# Patient Record
Sex: Female | Born: 1985
Health system: Southern US, Community
[De-identification: ages and names within clinical notes are randomized; demographics above are authoritative.]

## PROBLEM LIST (undated history)

## (undated) DIAGNOSIS — K219 Gastro-esophageal reflux disease without esophagitis: Secondary | ICD-10-CM

## (undated) DIAGNOSIS — E079 Disorder of thyroid, unspecified: Secondary | ICD-10-CM

## (undated) DIAGNOSIS — E049 Nontoxic goiter, unspecified: Secondary | ICD-10-CM

## (undated) HISTORY — DX: Disorder of thyroid, unspecified: E07.9

## (undated) HISTORY — DX: Gastro-esophageal reflux disease without esophagitis: K21.9

---
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
## 2005-04-14 ENCOUNTER — Other Ambulatory Visit: Admission: RE | Admit: 2005-04-14 | Discharge: 2005-04-14 | Payer: Self-pay | Admitting: Obstetrics and Gynecology

## 2005-08-22 ENCOUNTER — Emergency Department (HOSPITAL_COMMUNITY): Admission: EM | Admit: 2005-08-22 | Discharge: 2005-08-22 | Payer: Self-pay | Admitting: Emergency Medicine

## 2010-09-25 ENCOUNTER — Emergency Department (HOSPITAL_COMMUNITY)
Admission: EM | Admit: 2010-09-25 | Discharge: 2010-09-25 | Payer: Self-pay | Source: Home / Self Care | Admitting: Emergency Medicine

## 2010-11-11 ENCOUNTER — Emergency Department (HOSPITAL_COMMUNITY): Payer: 59

## 2010-11-11 ENCOUNTER — Emergency Department (HOSPITAL_COMMUNITY)
Admission: EM | Admit: 2010-11-11 | Discharge: 2010-11-11 | Disposition: A | Discharge status: Home / Self Care | Payer: 59 | Attending: Emergency Medicine | Admitting: Emergency Medicine

## 2010-11-11 DIAGNOSIS — R000 Tachycardia, unspecified: Secondary | ICD-10-CM | POA: Insufficient documentation

## 2010-11-11 DIAGNOSIS — R0602 Shortness of breath: Secondary | ICD-10-CM | POA: Insufficient documentation

## 2010-11-11 DIAGNOSIS — R071 Chest pain on breathing: Secondary | ICD-10-CM | POA: Insufficient documentation

## 2010-11-11 DIAGNOSIS — R0989 Other specified symptoms and signs involving the circulatory and respiratory systems: Secondary | ICD-10-CM | POA: Insufficient documentation

## 2010-11-11 DIAGNOSIS — R0609 Other forms of dyspnea: Secondary | ICD-10-CM | POA: Insufficient documentation

## 2010-11-11 LAB — POCT I-STAT, CHEM 8
Calcium, Ion: 1.12 mmol/L (ref 1.12–1.32)
Chloride: 106 mEq/L (ref 96–112)
Creatinine, Ser: 0.9 mg/dL (ref 0.4–1.2)
Glucose, Bld: 88 mg/dL (ref 70–99)
HCT: 43 % (ref 36.0–46.0)
Hemoglobin: 14.6 g/dL (ref 12.0–15.0)

## 2010-11-11 LAB — URINALYSIS, ROUTINE W REFLEX MICROSCOPIC
Bilirubin Urine: NEGATIVE
Ketones, ur: NEGATIVE mg/dL
Leukocytes, UA: NEGATIVE
Nitrite: NEGATIVE
Protein, ur: NEGATIVE mg/dL
Specific Gravity, Urine: 1.016 (ref 1.005–1.030)
Urine Glucose, Fasting: NEGATIVE mg/dL
Urobilinogen, UA: 0.2 mg/dL (ref 0.0–1.0)
pH: 6 (ref 5.0–8.0)

## 2010-11-11 LAB — POCT PREGNANCY, URINE: Preg Test, Ur: NEGATIVE

## 2010-11-11 LAB — URINE MICROSCOPIC-ADD ON

## 2010-11-11 MED ORDER — IOHEXOL 300 MG/ML  SOLN
100.0000 mL | Freq: Once | INTRAMUSCULAR | Status: AC | PRN
  Administered 2010-11-11: 100 mL via INTRAVENOUS

## 2010-12-27 ENCOUNTER — Other Ambulatory Visit (HOSPITAL_COMMUNITY): Payer: 59 | Admitting: Radiology

## 2011-01-02 ENCOUNTER — Other Ambulatory Visit (HOSPITAL_COMMUNITY): Payer: Self-pay | Admitting: Internal Medicine

## 2011-01-02 DIAGNOSIS — K828 Other specified diseases of gallbladder: Secondary | ICD-10-CM

## 2011-01-10 ENCOUNTER — Ambulatory Visit (HOSPITAL_COMMUNITY)
Admission: RE | Admit: 2011-01-10 | Discharge: 2011-01-10 | Disposition: A | Discharge status: Home / Self Care | Payer: 59 | Source: Ambulatory Visit | Attending: Internal Medicine | Admitting: Internal Medicine

## 2011-01-10 DIAGNOSIS — R1011 Right upper quadrant pain: Secondary | ICD-10-CM | POA: Insufficient documentation

## 2011-01-10 DIAGNOSIS — K828 Other specified diseases of gallbladder: Secondary | ICD-10-CM

## 2011-01-10 MED ORDER — TECHNETIUM TC 99M MEBROFENIN IV KIT
5.0000 | PACK | Freq: Once | INTRAVENOUS | Status: AC | PRN
  Administered 2011-01-10: 5 via INTRAVENOUS

## 2011-02-20 ENCOUNTER — Other Ambulatory Visit (HOSPITAL_COMMUNITY): Payer: Self-pay | Admitting: Neurology

## 2011-02-20 DIAGNOSIS — M542 Cervicalgia: Secondary | ICD-10-CM

## 2011-02-20 DIAGNOSIS — H5310 Unspecified subjective visual disturbances: Secondary | ICD-10-CM

## 2011-02-24 ENCOUNTER — Ambulatory Visit (HOSPITAL_COMMUNITY)
Admission: RE | Admit: 2011-02-24 | Discharge: 2011-02-24 | Disposition: A | Discharge status: Home / Self Care | Payer: 59 | Source: Ambulatory Visit | Attending: Neurology | Admitting: Neurology

## 2011-02-24 DIAGNOSIS — M542 Cervicalgia: Secondary | ICD-10-CM

## 2011-02-24 DIAGNOSIS — H5310 Unspecified subjective visual disturbances: Secondary | ICD-10-CM

## 2011-02-24 DIAGNOSIS — R51 Headache: Secondary | ICD-10-CM | POA: Insufficient documentation

## 2011-02-24 MED ORDER — GADOBENATE DIMEGLUMINE 529 MG/ML IV SOLN
14.0000 mL | Freq: Once | INTRAVENOUS | Status: AC
  Administered 2011-02-24: 14 mL via INTRAVENOUS

## 2013-02-16 ENCOUNTER — Other Ambulatory Visit: Payer: Self-pay | Admitting: Internal Medicine

## 2013-02-16 DIAGNOSIS — R609 Edema, unspecified: Secondary | ICD-10-CM

## 2013-02-17 ENCOUNTER — Ambulatory Visit
Admission: RE | Admit: 2013-02-17 | Discharge: 2013-02-17 | Disposition: A | Discharge status: Home / Self Care | Payer: 59 | Source: Ambulatory Visit | Attending: Internal Medicine | Admitting: Internal Medicine

## 2013-02-17 DIAGNOSIS — R609 Edema, unspecified: Secondary | ICD-10-CM

## 2013-04-07 IMAGING — NM NM HEPATO W/GB/PHARM/[PERSON_NAME]
2 series · 12 of 12 positions shown · non-contrast
Comparison: None

CLINICAL DATA: Right upper quadrant abdominal pain.

NUCLEAR MEDICINE HEPATOBILIARY IMAGING WITH GALLBLADDER EF
TECHNIQUE: Sequential images of the abdomen were obtained [DATE] minutes following intravenous administration of
radiopharmaceutical.  After slow intravenous infusion of
micrograms Cholecystokinin, gallbladder ejection fraction was
determined.
Radiopharmaceutical:  5.0 mCi 6c-44m Choletec

[hepato · 3.20mm/px · 6 of 51 frames shown (1 of 2)]
[frame 5/51]
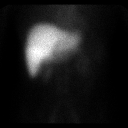
[frame 13/51]
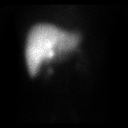
[frame 22/51]
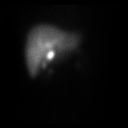
[frame 30/51]
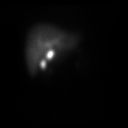
[frame 39/51]
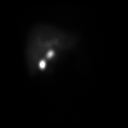
[frame 47/51]
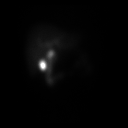

[hepato · 3.20mm/px · 6 of 31 frames shown (2 of 2)]
[frame 3/31]
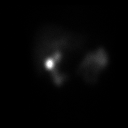
[frame 8/31]
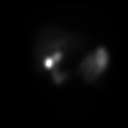
[frame 13/31]
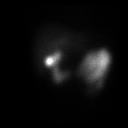
[frame 18/31]
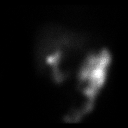
[frame 23/31]
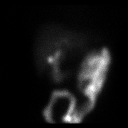
[frame 29/31]
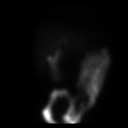

[12 of 12 positions shown; findings below may reference images not displayed]

FINDINGS: There is symmetric uptake in the liver and prompt
excretion into the biliary tree which is visualized by 10 minutes.
The gallbladder is visualized at 03minutes.  Activity is noted in
the small bowel at 20 minutes.

The patient received a protocoled infusion of CCK and a gallbladder
ejection fraction was estimated at 85%.  Normal is greater than
30%.

The patient did experience symptoms during CCK infusion.
IMPRESSION: Normal biliary patency study and gallbladder ejection fraction.

## 2013-09-15 DIAGNOSIS — E049 Nontoxic goiter, unspecified: Secondary | ICD-10-CM

## 2013-09-15 HISTORY — DX: Nontoxic goiter, unspecified: E04.9

## 2013-09-15 NOTE — L&D Delivery Note (Signed)
Delivery Note At 2:26 PM a viable female was delivered via Vaginal, Spontaneous Delivery (Presentation: Left Occiput Anterior).  APGAR:9/9 , ; weight .   Placenta status:manually removed>>intact, cavity explored >>clean,   Intact, Spontaneous.  Cord: 3 vessels with the following complications: None.  Cord pH: not sent  Anesthesia: Epidural  Episiotomy: None Lacerations: 2nd degree;Periurethral;Perineal Suture Repair: 3.0 vicryl rapide Est. Blood Loss (mL): 300  Mom to postpartum.  Baby to Couplet care / Skin to Skin.  Margarette Asal 04/16/2014, 2:46 PM

## 2013-09-20 LAB — OB RESULTS CONSOLE HEPATITIS B SURFACE ANTIGEN: Hepatitis B Surface Ag: NEGATIVE

## 2013-09-20 LAB — OB RESULTS CONSOLE RPR: RPR: NONREACTIVE

## 2013-09-20 LAB — OB RESULTS CONSOLE RUBELLA ANTIBODY, IGM: Rubella: IMMUNE

## 2013-09-20 LAB — OB RESULTS CONSOLE ABO/RH: RH Type: POSITIVE

## 2013-09-20 LAB — OB RESULTS CONSOLE ANTIBODY SCREEN: ANTIBODY SCREEN: NEGATIVE

## 2013-09-20 LAB — OB RESULTS CONSOLE HIV ANTIBODY (ROUTINE TESTING): HIV: NONREACTIVE

## 2013-10-04 LAB — OB RESULTS CONSOLE GC/CHLAMYDIA
Chlamydia: NEGATIVE
Gonorrhea: NEGATIVE

## 2014-02-13 ENCOUNTER — Inpatient Hospital Stay (HOSPITAL_COMMUNITY): Admission: AD | Admit: 2014-02-13 | Payer: 59 | Source: Ambulatory Visit | Admitting: Obstetrics and Gynecology

## 2014-04-16 ENCOUNTER — Inpatient Hospital Stay (HOSPITAL_COMMUNITY)
Admission: AD | Admit: 2014-04-16 | Discharge: 2014-04-18 | DRG: 774 | Disposition: A | Discharge status: Home / Self Care | Payer: 59 | Source: Ambulatory Visit | Attending: Obstetrics and Gynecology | Admitting: Obstetrics and Gynecology

## 2014-04-16 ENCOUNTER — Inpatient Hospital Stay (HOSPITAL_COMMUNITY): Payer: 59 | Admitting: Anesthesiology

## 2014-04-16 ENCOUNTER — Encounter (HOSPITAL_COMMUNITY): Payer: Self-pay | Admitting: *Deleted

## 2014-04-16 ENCOUNTER — Encounter (HOSPITAL_COMMUNITY): Payer: 59 | Admitting: Anesthesiology

## 2014-04-16 LAB — CBC
HCT: 38.2 % (ref 36.0–46.0)
Hemoglobin: 13.1 g/dL (ref 12.0–15.0)
MCH: 30.8 pg (ref 26.0–34.0)
MCHC: 34.3 g/dL (ref 30.0–36.0)
MCV: 89.7 fL (ref 78.0–100.0)
PLATELETS: 219 10*3/uL (ref 150–400)
RBC: 4.26 MIL/uL (ref 3.87–5.11)
RDW: 13.9 % (ref 11.5–15.5)
WBC: 12 10*3/uL — ABNORMAL HIGH (ref 4.0–10.5)

## 2014-04-16 LAB — RPR

## 2014-04-16 LAB — OB RESULTS CONSOLE GBS: STREP GROUP B AG: NEGATIVE

## 2014-04-16 MED ORDER — CITRIC ACID-SODIUM CITRATE 334-500 MG/5ML PO SOLN: 30.0000 mL | ORAL | Status: DC | PRN

## 2014-04-16 MED ORDER — PHENYLEPHRINE 40 MCG/ML (10ML) SYRINGE FOR IV PUSH (FOR BLOOD PRESSURE SUPPORT): 80.0000 ug | PREFILLED_SYRINGE | INTRAVENOUS | Status: DC | PRN

## 2014-04-16 MED ORDER — FLEET ENEMA 7-19 GM/118ML RE ENEM: 1.0000 | ENEMA | RECTAL | Status: DC | PRN

## 2014-04-16 MED ORDER — SENNOSIDES-DOCUSATE SODIUM 8.6-50 MG PO TABS
2.0000 | ORAL_TABLET | ORAL | Status: DC
  Administered 2014-04-17: 2 via ORAL
  Filled 2014-04-16 (×2): qty 2

## 2014-04-16 MED ORDER — EPHEDRINE 5 MG/ML INJ
10.0000 mg | INTRAVENOUS | Status: DC | PRN
  Administered 2014-04-16: 15 mg via INTRAVENOUS

## 2014-04-16 MED ORDER — OXYTOCIN BOLUS FROM INFUSION: 500.0000 mL | INTRAVENOUS | Status: DC

## 2014-04-16 MED ORDER — PRENATAL MULTIVITAMIN CH
1.0000 | ORAL_TABLET | Freq: Every day | ORAL | Status: DC
  Administered 2014-04-17: 1 via ORAL
  Filled 2014-04-16: qty 1

## 2014-04-16 MED ORDER — BISACODYL 10 MG RE SUPP: 10.0000 mg | Freq: Every day | RECTAL | Status: DC | PRN

## 2014-04-16 MED ORDER — ONDANSETRON HCL 4 MG/2ML IJ SOLN: 4.0000 mg | INTRAMUSCULAR | Status: DC | PRN

## 2014-04-16 MED ORDER — PHENYLEPHRINE 40 MCG/ML (10ML) SYRINGE FOR IV PUSH (FOR BLOOD PRESSURE SUPPORT)
80.0000 ug | PREFILLED_SYRINGE | INTRAVENOUS | Status: DC | PRN
  Filled 2014-04-16: qty 10

## 2014-04-16 MED ORDER — IBUPROFEN 800 MG PO TABS
800.0000 mg | ORAL_TABLET | Freq: Three times a day (TID) | ORAL | Status: DC | PRN
  Administered 2014-04-16 – 2014-04-18 (×5): 800 mg via ORAL
  Filled 2014-04-16 (×5): qty 1

## 2014-04-16 MED ORDER — DIPHENHYDRAMINE HCL 25 MG PO CAPS: 25.0000 mg | ORAL_CAPSULE | Freq: Four times a day (QID) | ORAL | Status: DC | PRN

## 2014-04-16 MED ORDER — TETANUS-DIPHTH-ACELL PERTUSSIS 5-2.5-18.5 LF-MCG/0.5 IM SUSP
0.5000 mL | Freq: Once | INTRAMUSCULAR | Status: DC
  Filled 2014-04-16: qty 0.5

## 2014-04-16 MED ORDER — FENTANYL 2.5 MCG/ML BUPIVACAINE 1/10 % EPIDURAL INFUSION (WH - ANES)
INTRAMUSCULAR | Status: DC | PRN
  Administered 2014-04-16: 14 mL/h via EPIDURAL

## 2014-04-16 MED ORDER — DIPHENHYDRAMINE HCL 50 MG/ML IJ SOLN: 12.5000 mg | INTRAMUSCULAR | Status: DC | PRN

## 2014-04-16 MED ORDER — DIBUCAINE 1 % RE OINT: 1.0000 "application " | TOPICAL_OINTMENT | RECTAL | Status: DC | PRN

## 2014-04-16 MED ORDER — OXYCODONE-ACETAMINOPHEN 5-325 MG PO TABS: 1.0000 | ORAL_TABLET | Freq: Four times a day (QID) | ORAL | Status: DC | PRN

## 2014-04-16 MED ORDER — LANOLIN HYDROUS EX OINT: TOPICAL_OINTMENT | CUTANEOUS | Status: DC | PRN

## 2014-04-16 MED ORDER — MEASLES, MUMPS & RUBELLA VAC ~~LOC~~ INJ
0.5000 mL | INJECTION | Freq: Once | SUBCUTANEOUS | Status: DC
  Filled 2014-04-16: qty 0.5

## 2014-04-16 MED ORDER — LIDOCAINE HCL (PF) 1 % IJ SOLN
INTRAMUSCULAR | Status: DC | PRN
  Administered 2014-04-16 (×2): 9 mL

## 2014-04-16 MED ORDER — IBUPROFEN 600 MG PO TABS: 600.0000 mg | ORAL_TABLET | Freq: Four times a day (QID) | ORAL | Status: DC | PRN

## 2014-04-16 MED ORDER — LACTATED RINGERS IV SOLN
500.0000 mL | INTRAVENOUS | Status: DC | PRN
  Administered 2014-04-16: 500 mL via INTRAVENOUS

## 2014-04-16 MED ORDER — FLEET ENEMA 7-19 GM/118ML RE ENEM: 1.0000 | ENEMA | Freq: Every day | RECTAL | Status: DC | PRN

## 2014-04-16 MED ORDER — ZOLPIDEM TARTRATE 5 MG PO TABS: 5.0000 mg | ORAL_TABLET | Freq: Every evening | ORAL | Status: DC | PRN

## 2014-04-16 MED ORDER — SIMETHICONE 80 MG PO CHEW: 80.0000 mg | CHEWABLE_TABLET | ORAL | Status: DC | PRN

## 2014-04-16 MED ORDER — FENTANYL 2.5 MCG/ML BUPIVACAINE 1/10 % EPIDURAL INFUSION (WH - ANES)
14.0000 mL/h | INTRAMUSCULAR | Status: DC | PRN
  Administered 2014-04-16: 14 mL/h via EPIDURAL
  Filled 2014-04-16: qty 125

## 2014-04-16 MED ORDER — ONDANSETRON HCL 4 MG PO TABS: 4.0000 mg | ORAL_TABLET | ORAL | Status: DC | PRN

## 2014-04-16 MED ORDER — ACETAMINOPHEN 325 MG PO TABS
650.0000 mg | ORAL_TABLET | ORAL | Status: DC | PRN
  Filled 2014-04-16: qty 2

## 2014-04-16 MED ORDER — LIDOCAINE HCL (PF) 1 % IJ SOLN: 30.0000 mL | INTRAMUSCULAR | Status: DC | PRN

## 2014-04-16 MED ORDER — OXYTOCIN 40 UNITS IN LACTATED RINGERS INFUSION - SIMPLE MED
62.5000 mL/h | INTRAVENOUS | Status: DC
  Administered 2014-04-16: 500 mL/h via INTRAVENOUS
  Filled 2014-04-16: qty 1000

## 2014-04-16 MED ORDER — EPHEDRINE 5 MG/ML INJ
10.0000 mg | INTRAVENOUS | Status: DC | PRN
  Filled 2014-04-16: qty 4

## 2014-04-16 MED ORDER — LACTATED RINGERS IV SOLN
INTRAVENOUS | Status: DC
  Administered 2014-04-16 (×2): via INTRAVENOUS

## 2014-04-16 MED ORDER — LACTATED RINGERS IV SOLN
500.0000 mL | Freq: Once | INTRAVENOUS | Status: AC
  Administered 2014-04-16: 500 mL via INTRAVENOUS

## 2014-04-16 MED ORDER — BENZOCAINE-MENTHOL 20-0.5 % EX AERO
1.0000 "application " | INHALATION_SPRAY | CUTANEOUS | Status: DC | PRN
  Administered 2014-04-16: 1 via TOPICAL
  Filled 2014-04-16: qty 56

## 2014-04-16 MED ORDER — OXYCODONE-ACETAMINOPHEN 5-325 MG PO TABS: 1.0000 | ORAL_TABLET | ORAL | Status: DC | PRN

## 2014-04-16 MED ORDER — WITCH HAZEL-GLYCERIN EX PADS: 1.0000 "application " | MEDICATED_PAD | CUTANEOUS | Status: DC | PRN

## 2014-04-16 MED ORDER — ONDANSETRON HCL 4 MG/2ML IJ SOLN: 4.0000 mg | Freq: Four times a day (QID) | INTRAMUSCULAR | Status: DC | PRN

## 2014-04-16 NOTE — H&P (Signed)
Jacqueline Chen is a 28 y.o. female presenting for labor. Maternal Medical History:  Reason for admission: Contractions.   Contractions: Onset was 3-5 hours ago.   Frequency: regular.   Perceived severity is moderate.    Fetal activity: Perceived fetal activity is normal.   Last perceived fetal movement was within the past hour.      OB History   Grav Para Term Preterm Abortions TAB SAB Ect Mult Living   1              History reviewed. No pertinent past medical history. History reviewed. No pertinent past surgical history. Family History: family history is not on file. Social History:  reports that she has never smoked. She has never used smokeless tobacco. She reports that she does not drink alcohol or use illicit drugs.   Prenatal Transfer Tool  Maternal Diabetes: No Genetic Screening: Normal Maternal Ultrasounds/Referrals: Normal Fetal Ultrasounds or other Referrals:  None Maternal Substance Abuse:  No Significant Maternal Medications:  None Significant Maternal Lab Results:  None Other Comments:  None  ROS  Dilation: 3 Effacement (%): 90 Station: -2 Exam by:: Deboraha Sprang, RN Blood pressure 120/71, pulse 109, temperature 97.9 F (36.6 C), temperature source Oral, resp. rate 18, height 5\' 7"  (1.702 m), weight 202 lb 6.4 oz (91.808 kg). Maternal Exam:  Abdomen: Fundal height is term FH.   Estimated fetal weight is AGA.   Fetal presentation: vertex  Introitus: Normal vulva. Normal vagina.  Cervix: Cervix evaluated by digital exam.     Physical Exam  Constitutional: She is oriented to person, place, and time. She appears well-developed and well-nourished.  HENT:  Head: Normocephalic and atraumatic.  Neck: Normal range of motion. Neck supple.  Cardiovascular: Normal rate and regular rhythm.   Respiratory: Effort normal and breath sounds normal.  GI:  Term FH/FHR 148  Genitourinary:  3/C/vtx BOW I  Musculoskeletal: Normal range of motion.  Neurological:  She is alert and oriented to person, place, and time.    Prenatal labs: ABO, Rh: O/Positive/-- (01/06 0000) Antibody: Negative (01/06 0000) Rubella: Immune (01/06 0000) RPR: Nonreactive (01/06 0000)  HBsAg: Negative (01/06 0000)  HIV: Non-reactive (01/06 0000)  GBS: Negative (08/02 0453)   Assessment/Plan: Term IUP/labor   Margarette Asal 04/16/2014, 6:59 AM

## 2014-04-16 NOTE — MAU Note (Signed)
Woke up at 1130 pm with contractions

## 2014-04-16 NOTE — Anesthesia Procedure Notes (Signed)
Epidural Patient location during procedure: OB Start time: 04/16/2014 9:42 AM End time: 04/16/2014 9:46 AM  Staffing Anesthesiologist: Lyn Hollingshead Performed by: anesthesiologist   Preanesthetic Checklist Completed: patient identified, surgical consent, pre-op evaluation, timeout performed, IV checked, risks and benefits discussed and monitors and equipment checked  Epidural Patient position: sitting Prep: site prepped and draped and DuraPrep Patient monitoring: continuous pulse ox and blood pressure Approach: midline Location: L3-L4 Injection technique: LOR air  Needle:  Needle type: Tuohy  Needle gauge: 17 G Needle length: 9 cm and 9 Needle insertion depth: 7 cm Catheter type: closed end flexible Catheter size: 19 Gauge Catheter at skin depth: 9 cm Test dose: negative and Other  Assessment Sensory level: T9 Events: blood not aspirated, injection not painful, no injection resistance, negative IV test and no paresthesia  Additional Notes Reason for block:procedure for pain

## 2014-04-16 NOTE — Anesthesia Preprocedure Evaluation (Signed)

## 2014-04-16 NOTE — Treatment Plan (Signed)
Dr Matthew Saras notified of patient.  Pt may ambulate x1 hour and check cervix

## 2014-04-17 LAB — CBC
HCT: 34.7 % — ABNORMAL LOW (ref 36.0–46.0)
Hemoglobin: 11.7 g/dL — ABNORMAL LOW (ref 12.0–15.0)
MCH: 30.5 pg (ref 26.0–34.0)
MCHC: 33.7 g/dL (ref 30.0–36.0)
MCV: 90.4 fL (ref 78.0–100.0)
PLATELETS: 168 10*3/uL (ref 150–400)
RBC: 3.84 MIL/uL — ABNORMAL LOW (ref 3.87–5.11)
RDW: 14.3 % (ref 11.5–15.5)
WBC: 11.1 10*3/uL — AB (ref 4.0–10.5)

## 2014-04-17 NOTE — Lactation Note (Signed)
This note was copied from the chart of Jacqueline Chen. Lactation Consultation Note  Patient Name: Jacqueline Chen [id number] Date: 04/17/2014 Reason for consult: Follow-up assessment Assisted Mom with positioning and latching baby. Baby has tight mouth with suck exam, jaw massage demonstrated to Mom. Reviewed hand expression with Mom and advised to hand express prior to latching baby. It took baby few attempts to latch but once latch demonstrated a good rhythmic suck with some swallows noted. Basic teaching reviewed with Mom. Reviewed cluster feeding. Mom reports nipple tenderness is improving. Advised to apply EBM prn. Advised to call for assist with latch as needed.   Maternal Data    Feeding Feeding Type: Breast Fed Length of feed: 2 min  LATCH Score/Interventions Latch: Repeated attempts needed to sustain latch, nipple held in mouth throughout feeding, stimulation needed to elicit sucking reflex. Intervention(s): Adjust position;Assist with latch;Breast massage;Breast compression  Audible Swallowing: A few with stimulation  Type of Nipple: Everted at rest and after stimulation  Comfort (Breast/Nipple): Filling, red/small blisters or bruises, mild/mod discomfort  Problem noted: Mild/Moderate discomfort Interventions  (Cracked/bleeding/bruising/blister): Expressed breast milk to nipple  Hold (Positioning): Assistance needed to correctly position infant at breast and maintain latch. Intervention(s): Breastfeeding basics reviewed;Support Pillows;Position options;Skin to skin  LATCH Score: 6  Lactation Tools Discussed/Used     Consult Status Consult Status: Follow-up Date: 04/18/14 Follow-up type: In-patient    Katrine Coho 04/17/2014, 6:09 PM

## 2014-04-17 NOTE — Anesthesia Postprocedure Evaluation (Signed)
Anesthesia Post Note  Patient: Jacqueline Chen  Procedure(s) Performed: * No procedures listed *  Anesthesia type: Epidural  Patient location: Mother/Baby  Post pain: Pain level controlled  Post assessment: Post-op Vital signs reviewed  Last Vitals:  Filed Vitals:   04/17/14 0605  BP: 100/66  Pulse: 84  Temp: 36.7 C  Resp: 17    Post vital signs: Reviewed  Level of consciousness:alert  Complications: No apparent anesthesia complications

## 2014-04-17 NOTE — Lactation Note (Signed)
This note was copied from the chart of Jacqueline Chen. Lactation Consultation Note Mom c/o sore nipples, noted bruising to Lt. [place]. Hand expression taught w/noted colostrum. Needed stimulation to nurse. Will not open mouth very wide. Noted slight under bite, instructed to do chin tug. Moms nipples everted, instructed "C" positioning. Mom encouraged to feed baby 8-12 times/24 hours and with feeding cues. Mom encouraged to feed baby w/feeding cuesReviewed Baby & Me book's Breastfeeding Basics. Encouraged to call for assistance if needed and to verify proper latch.[place] brochure given w/resources, support groups and [place] services. Educated about newborn behavior. Referred to Baby and Me Book in Breastfeeding section Pg. 22-23 for position options and Proper latch demonstration. Patient Name: Jacqueline Chen [id number] Date: 04/17/2014 Reason for consult: Initial assessment   Maternal Data Has patient been taught Hand Expression?: Yes Does the patient have breastfeeding experience prior to this delivery?: No  Feeding Feeding Type: Breast Fed Length of feed: 5 min (still feeding)  LATCH Score/Interventions Latch: Repeated attempts needed to sustain latch, nipple held in mouth throughout feeding, stimulation needed to elicit sucking reflex. Intervention(s): Adjust position;Assist with latch;Breast massage;Breast compression  Audible Swallowing: None Intervention(s): Skin to skin;Hand expression  Type of Nipple: Everted at rest and after stimulation  Comfort (Breast/Nipple): Filling, red/small blisters or bruises, mild/mod discomfort  Problem noted: Cracked, bleeding, blisters, bruises Interventions  (Cracked/bleeding/bruising/blister): Expressed breast milk to nipple  Hold (Positioning): Assistance needed to correctly position infant at breast and maintain latch. Intervention(s): Breastfeeding basics reviewed;Support Pillows;Position options;Skin to skin  LATCH Score: 5  Lactation Tools  Discussed/Used     Consult Status Consult Status: Follow-up Date: 04/17/14 Follow-up type: In-patient    Theodoro Kalata 04/17/2014, 5:54 AM

## 2014-04-17 NOTE — Progress Notes (Signed)
Post Partum Day 1 Subjective: no complaints, up ad lib, voiding, tolerating PO and + flatus  Objective: Blood pressure 100/66, pulse 84, temperature 98 F (36.7 C), temperature source Oral, resp. rate 17, height 5\' 7"  (1.702 m), weight 202 lb 6.4 oz (91.808 kg), unknown if currently breastfeeding.  Physical Exam:  General: alert and cooperative Lochia: appropriate Uterine Fundus: firm Incision: perineum intact DVT Evaluation: No evidence of DVT seen on physical exam. Negative Homan's sign. No cords or calf tenderness. No significant calf/ankle edema.   Recent Labs  04/16/14 0655 04/17/14 0601  HGB 13.1 11.7*  HCT 38.2 34.7*    Assessment/Plan: Plan for discharge tomorrow   LOS: 1 day   CURTIS,CAROL G 04/17/2014, 8:22 AM

## 2014-04-18 MED ORDER — IBUPROFEN 800 MG PO TABS: 800.0000 mg | ORAL_TABLET | Freq: Three times a day (TID) | ORAL | Status: DC | PRN

## 2014-04-18 NOTE — Discharge Summary (Signed)
Obstetric Discharge Summary Reason for Admission: onset of labor Prenatal Procedures: ultrasound Intrapartum Procedures: spontaneous vaginal delivery Postpartum Procedures: none Complications-Operative and Postpartum: 2 degree perineal laceration Hemoglobin  Date Value Ref Range Status  04/17/2014 11.7* 12.0 - 15.0 g/dL Final     HCT  Date Value Ref Range Status  04/17/2014 34.7* 36.0 - 46.0 % Final    Physical Exam:  General: alert and cooperative Lochia: appropriate Uterine Fundus: firm Incision: perineum intact DVT Evaluation: No evidence of DVT seen on physical exam. Negative Homan's sign. No cords or calf tenderness. No significant calf/ankle edema.  Discharge Diagnoses: Term Pregnancy-delivered  Discharge Information: Date: 04/18/2014 Activity: pelvic rest Diet: routine Medications: PNV and Ibuprofen Condition: stable Instructions: refer to practice specific booklet Discharge to: home   Newborn Data: Live born female  Birth Weight: 8 lb 1.1 oz (3660 g) APGAR: 8, 9  Home with mother.  Mitch Arquette G 04/18/2014, 7:51 AM

## 2014-04-18 NOTE — Lactation Note (Addendum)
This note was copied from the chart of Jacqueline Aelyn Stanaland. Lactation Consultation Note  Patient Name: Jacqueline Chen [id number] Date: 04/18/2014 Reason for consult: Follow-up assessment Baby 44 hours of life. Mom reports nursing going very well. Mom denies breast/nipple pain. Baby latch deeply, suckling rhythmically with lips flanged outward and swallows noted. Discussed engorgement prevention/treatment, OP/BFSG and calling with any questions/concerns. Discussed how to prepare for nursing/pumping when returning to work. Referred mom to Baby and Me booklet for EBM storage guidelines and number of diapers to expect.  Maternal Data    Feeding Feeding Type:  (Mom nursing when [facility] entered room.)  LATCH Score/Interventions Latch:  (Mom reports no problems with latching baby onto breast.)  Audible Swallowing: A few with stimulation  Type of Nipple: Everted at rest and after stimulation  Comfort (Breast/Nipple): Filling, red/small blisters or bruises, mild/mod discomfort  Problem noted: Mild/Moderate discomfort Interventions  (Cracked/bleeding/bruising/blister): Expressed breast milk to nipple Interventions (Mild/moderate discomfort): Comfort gels  Hold (Positioning): No assistance needed to correctly position infant at breast.  LATCH Score: 8  Lactation Tools Discussed/Used     Consult Status Consult Status: Complete    Lesli Albee, Jolly Carlini 04/18/2014, 10:53 AM

## 2014-06-13 ENCOUNTER — Ambulatory Visit (HOSPITAL_COMMUNITY)
Admission: RE | Admit: 2014-06-13 | Discharge: 2014-06-13 | Disposition: A | Discharge status: Home / Self Care | Payer: 59 | Source: Ambulatory Visit | Attending: Obstetrics and Gynecology | Admitting: Obstetrics and Gynecology

## 2014-06-13 NOTE — Lactation Note (Signed)
Lactation Consult  Mother's reason for visit:  Breastfeeding and preparing back back to  work  Visit Type:  Feeding assessment  Appointment Notes:  BTW questions , apt made form phone call  Consult:  Follow-Up Lactation Consultant:  Myer Haff  ________________________________________________________________________  Jacqueline Chen Name: Jacqueline Chen  Date of Birth: 04/16/2014  Pediatrician: Dr. Monna Fam  Gender: female  Gestational Age: [redacted]w[redacted]d (At Birth)  Birth Weight: 8 lb 1.1 oz (3660 g)  Weight at Discharge: Weight: 7 lb 9 oz (3430 g) Date of Discharge: 04/18/2014  Filed Weights   04/16/14 1426 04/17/14 0010 04/17/14 2344  Weight: 8 lb 1.1 oz (3660 g) 8 lb 0.8 oz (3650 g) 7 lb 9 oz (3430 g)  Last weight taken from location outside of [facility]: 11.1 oz  Location:Pediatrician's office  Weight today: 12.6.3 oz    ________________________________________________________________________  Mother's Name: Milagros Evener Type of delivery:   Breastfeeding Experience:  Per mom very good   ________________________________________________________________________  Breastfeeding History (Post Discharge)  Frequency of breastfeeding:  Every 2-4 hours days , and every  4 hours at night  Duration of feeding:  7-10 mins   Pumping - per mom - Once a day with a DEBP with 8 oz return  Supplementing - none , except when dad gives bottle EBM for a feeding and I pump   Infant Intake and Output Assessment  Voids:  8-10  in 24 hrs.  Color:  Clear yellow Stools:  1  in 24 hrs.  Color:  Yellow  @consult  after feeding baby had a large pea green stool - loose , per mom it was the 1st stool she had had in 3 days   ________________________________________________________________________  Maternal Breast Assessment  Breast:  Full Nipple:  Flat Pain level:  0 Pain interventions:  Expressed breast milk  _______________________________________________________________________ Feeding  Assessment/Evaluation  Initial feeding assessment:  Infant's oral assessment:  WNL  Positioning:  Cross cradle Right breast  LATCH documentation:  Latch:  2 = Grasps breast easily, tongue down, lips flanged, rhythmical sucking.  Audible swallowing:  2 = Spontaneous and intermittent  Type of nipple:  2 = Everted at rest and after stimulation  Comfort (Breast/Nipple):  1 = Filling, red/small blisters or bruises, mild/mod discomfort ( Full , no breakdown noted )   Hold (Positioning):  2 = No assistance needed to correctly position infant at breast  LATCH score:  9   Attached assessment:  Deep  Lips flanged:  Yes.    Lips untucked:  Yes.    Suck assessment:  Nutritive  Tools:  None  Instructed on use and cleaning of tool:  Non tools   Pre-feed weight:  5622g  (12 lb. 6.3  oz.) Post-feed weight:  5698 g (12 lb. 9.0  oz.) Amount transferred:  76  ml Amount supplemented:  None needed   Baby content after feeding and after large stool diaper changed , only fed on the 1st breast ,   Total amount pumped post feed:  None   Total amount transferred:  76 ml  Total supplement given:  None   Lactation Plan of Care-  Praised mom for her efforts breast feeding and pumping to prepare to return to work                                         - per mom F/U with  Dr. Janann Colonel 10/9                                         - Encouraged mom to attend the BFSG on Monday Evening at 7pm or Tuesday am at 11am to share her success                                        - Breast feeding latching - If breast are full to start , hand express or pump off some milk to baby gets to creamy fat                                         - transitioning back to work - Continue the plan you have started - doing well with preparation to return to work                                                                                          - Post pump after am feeding, and pump when dad is gibing the baby her one bottle  a day                                         - The day you return to work , feed the baby and plan on pumping both breast for 10-15 mins to soften well                                         - Plan on pumping at work 10-15 mins about every 2-4 hours , soften breast well                                         - If you go to feed the abby at day care , plan to have dad feed the bottle at dinner time and pump

## 2014-07-13 ENCOUNTER — Other Ambulatory Visit: Payer: Self-pay | Admitting: Internal Medicine

## 2014-07-13 DIAGNOSIS — E049 Nontoxic goiter, unspecified: Secondary | ICD-10-CM

## 2014-07-14 ENCOUNTER — Ambulatory Visit
Admission: RE | Admit: 2014-07-14 | Discharge: 2014-07-14 | Disposition: A | Discharge status: Home / Self Care | Payer: 59 | Source: Ambulatory Visit | Attending: Internal Medicine | Admitting: Internal Medicine

## 2014-07-14 DIAGNOSIS — E049 Nontoxic goiter, unspecified: Secondary | ICD-10-CM

## 2014-07-17 ENCOUNTER — Encounter (HOSPITAL_COMMUNITY): Payer: Self-pay | Admitting: *Deleted

## 2014-11-20 ENCOUNTER — Telehealth: Payer: Self-pay | Admitting: Family

## 2014-11-20 DIAGNOSIS — J019 Acute sinusitis, unspecified: Secondary | ICD-10-CM

## 2014-11-20 MED ORDER — AMOXICILLIN-POT CLAVULANATE 875-125 MG PO TABS: 1.0000 | ORAL_TABLET | Freq: Two times a day (BID) | ORAL | Status: DC

## 2014-11-20 NOTE — Progress Notes (Signed)

## 2015-07-19 LAB — T4, FREE: FREE T4: 1.3

## 2015-07-19 LAB — TSH: TSH: 2.02 (ref <=5.90)

## 2015-07-20 ENCOUNTER — Other Ambulatory Visit: Payer: Self-pay | Admitting: Internal Medicine

## 2015-07-20 DIAGNOSIS — E049 Nontoxic goiter, unspecified: Secondary | ICD-10-CM

## 2015-07-23 ENCOUNTER — Ambulatory Visit
Admission: RE | Admit: 2015-07-23 | Discharge: 2015-07-23 | Disposition: A | Discharge status: Home / Self Care | Payer: 59 | Source: Ambulatory Visit | Attending: Internal Medicine | Admitting: Internal Medicine

## 2015-07-23 DIAGNOSIS — E049 Nontoxic goiter, unspecified: Secondary | ICD-10-CM

## 2015-09-20 DIAGNOSIS — M9901 Segmental and somatic dysfunction of cervical region: Secondary | ICD-10-CM | POA: Diagnosis not present

## 2015-09-20 DIAGNOSIS — R51 Headache: Secondary | ICD-10-CM | POA: Diagnosis not present

## 2015-09-20 DIAGNOSIS — M9902 Segmental and somatic dysfunction of thoracic region: Secondary | ICD-10-CM | POA: Diagnosis not present

## 2015-09-21 ENCOUNTER — Telehealth: Payer: 59 | Admitting: Nurse Practitioner

## 2015-09-21 DIAGNOSIS — J019 Acute sinusitis, unspecified: Secondary | ICD-10-CM | POA: Diagnosis not present

## 2015-09-21 MED ORDER — AMOXICILLIN-POT CLAVULANATE 875-125 MG PO TABS: 1.0000 | ORAL_TABLET | Freq: Two times a day (BID) | ORAL | Status: DC

## 2015-09-21 MED FILL — AMOX TR-K CLV 875-125 MG TA: 875-125 | 7 days supply | Qty: 14 | Fill #0

## 2015-09-21 NOTE — Progress Notes (Signed)

## 2015-10-18 DIAGNOSIS — M9902 Segmental and somatic dysfunction of thoracic region: Secondary | ICD-10-CM | POA: Diagnosis not present

## 2015-10-18 DIAGNOSIS — R51 Headache: Secondary | ICD-10-CM | POA: Diagnosis not present

## 2015-10-18 DIAGNOSIS — M9901 Segmental and somatic dysfunction of cervical region: Secondary | ICD-10-CM | POA: Diagnosis not present

## 2015-10-25 DIAGNOSIS — M9902 Segmental and somatic dysfunction of thoracic region: Secondary | ICD-10-CM | POA: Diagnosis not present

## 2015-10-25 DIAGNOSIS — R51 Headache: Secondary | ICD-10-CM | POA: Diagnosis not present

## 2015-10-25 DIAGNOSIS — M9901 Segmental and somatic dysfunction of cervical region: Secondary | ICD-10-CM | POA: Diagnosis not present

## 2015-10-29 MED FILL — LEVOTHYROXINE 25 MCG TABLET: 25 | 90 days supply | Qty: 90 | Fill #0

## 2015-10-29 MED FILL — LARIN FE 1-20 TABLET: 1-20 | 84 days supply | Qty: 84 | Fill #3

## 2015-11-22 DIAGNOSIS — R51 Headache: Secondary | ICD-10-CM | POA: Diagnosis not present

## 2015-11-22 DIAGNOSIS — M9901 Segmental and somatic dysfunction of cervical region: Secondary | ICD-10-CM | POA: Diagnosis not present

## 2015-11-22 DIAGNOSIS — M9902 Segmental and somatic dysfunction of thoracic region: Secondary | ICD-10-CM | POA: Diagnosis not present

## 2015-12-18 DIAGNOSIS — R51 Headache: Secondary | ICD-10-CM | POA: Diagnosis not present

## 2015-12-18 DIAGNOSIS — M9901 Segmental and somatic dysfunction of cervical region: Secondary | ICD-10-CM | POA: Diagnosis not present

## 2015-12-18 DIAGNOSIS — M9902 Segmental and somatic dysfunction of thoracic region: Secondary | ICD-10-CM | POA: Diagnosis not present

## 2015-12-19 ENCOUNTER — Telehealth: Payer: 59 | Admitting: Family

## 2015-12-19 DIAGNOSIS — H1089 Other conjunctivitis: Secondary | ICD-10-CM | POA: Diagnosis not present

## 2015-12-19 DIAGNOSIS — H109 Unspecified conjunctivitis: Secondary | ICD-10-CM

## 2015-12-19 DIAGNOSIS — A499 Bacterial infection, unspecified: Secondary | ICD-10-CM

## 2015-12-19 MED ORDER — POLYMYXIN B-TRIMETHOPRIM 10000-0.1 UNIT/ML-% OP SOLN: 1.0000 [drp] | OPHTHALMIC | Status: DC

## 2015-12-19 MED FILL — POLYMYXIN B/TMP EYE DROPS: 10000-0.1 | 16 days supply | Qty: 10 | Fill #0

## 2015-12-19 NOTE — Progress Notes (Signed)

## 2015-12-20 DIAGNOSIS — H52222 Regular astigmatism, left eye: Secondary | ICD-10-CM | POA: Diagnosis not present

## 2015-12-20 DIAGNOSIS — H5212 Myopia, left eye: Secondary | ICD-10-CM | POA: Diagnosis not present

## 2016-01-15 DIAGNOSIS — M9902 Segmental and somatic dysfunction of thoracic region: Secondary | ICD-10-CM | POA: Diagnosis not present

## 2016-01-15 DIAGNOSIS — M9901 Segmental and somatic dysfunction of cervical region: Secondary | ICD-10-CM | POA: Diagnosis not present

## 2016-01-15 DIAGNOSIS — R51 Headache: Secondary | ICD-10-CM | POA: Diagnosis not present

## 2016-01-21 MED FILL — LARIN FE 1-20 TABLET: 1-20 | 56 days supply | Qty: 56 | Fill #4

## 2016-01-28 MED FILL — LEVOTHYROXINE 25 MCG TABLET: 25 | 90 days supply | Qty: 90 | Fill #1

## 2016-02-19 DIAGNOSIS — M9902 Segmental and somatic dysfunction of thoracic region: Secondary | ICD-10-CM | POA: Diagnosis not present

## 2016-02-19 DIAGNOSIS — R51 Headache: Secondary | ICD-10-CM | POA: Diagnosis not present

## 2016-02-19 DIAGNOSIS — M9901 Segmental and somatic dysfunction of cervical region: Secondary | ICD-10-CM | POA: Diagnosis not present

## 2016-03-25 DIAGNOSIS — M9902 Segmental and somatic dysfunction of thoracic region: Secondary | ICD-10-CM | POA: Diagnosis not present

## 2016-03-25 DIAGNOSIS — R51 Headache: Secondary | ICD-10-CM | POA: Diagnosis not present

## 2016-03-25 DIAGNOSIS — M9901 Segmental and somatic dysfunction of cervical region: Secondary | ICD-10-CM | POA: Diagnosis not present

## 2016-04-22 DIAGNOSIS — R51 Headache: Secondary | ICD-10-CM | POA: Diagnosis not present

## 2016-04-22 DIAGNOSIS — M9902 Segmental and somatic dysfunction of thoracic region: Secondary | ICD-10-CM | POA: Diagnosis not present

## 2016-04-22 DIAGNOSIS — M9901 Segmental and somatic dysfunction of cervical region: Secondary | ICD-10-CM | POA: Diagnosis not present

## 2016-04-28 MED FILL — LEVOTHYROXINE 25 MCG TABLET: 25 | 90 days supply | Qty: 90 | Fill #0

## 2016-05-20 DIAGNOSIS — R51 Headache: Secondary | ICD-10-CM | POA: Diagnosis not present

## 2016-05-20 DIAGNOSIS — M9902 Segmental and somatic dysfunction of thoracic region: Secondary | ICD-10-CM | POA: Diagnosis not present

## 2016-05-20 DIAGNOSIS — M9901 Segmental and somatic dysfunction of cervical region: Secondary | ICD-10-CM | POA: Diagnosis not present

## 2016-05-29 DIAGNOSIS — Z01419 Encounter for gynecological examination (general) (routine) without abnormal findings: Secondary | ICD-10-CM | POA: Diagnosis not present

## 2016-05-29 DIAGNOSIS — Z6824 Body mass index (BMI) 24.0-24.9, adult: Secondary | ICD-10-CM | POA: Diagnosis not present

## 2016-06-17 DIAGNOSIS — M9901 Segmental and somatic dysfunction of cervical region: Secondary | ICD-10-CM | POA: Diagnosis not present

## 2016-06-17 DIAGNOSIS — R51 Headache: Secondary | ICD-10-CM | POA: Diagnosis not present

## 2016-06-17 DIAGNOSIS — M9902 Segmental and somatic dysfunction of thoracic region: Secondary | ICD-10-CM | POA: Diagnosis not present

## 2016-07-15 DIAGNOSIS — M9901 Segmental and somatic dysfunction of cervical region: Secondary | ICD-10-CM | POA: Diagnosis not present

## 2016-07-15 DIAGNOSIS — M9902 Segmental and somatic dysfunction of thoracic region: Secondary | ICD-10-CM | POA: Diagnosis not present

## 2016-07-15 DIAGNOSIS — R51 Headache: Secondary | ICD-10-CM | POA: Diagnosis not present

## 2016-07-28 DIAGNOSIS — L814 Other melanin hyperpigmentation: Secondary | ICD-10-CM | POA: Diagnosis not present

## 2016-07-28 DIAGNOSIS — D235 Other benign neoplasm of skin of trunk: Secondary | ICD-10-CM | POA: Diagnosis not present

## 2016-07-28 DIAGNOSIS — D1801 Hemangioma of skin and subcutaneous tissue: Secondary | ICD-10-CM | POA: Diagnosis not present

## 2016-07-28 MED FILL — LEVOTHYROXINE 25 MCG TABLET: 25 | 90 days supply | Qty: 90 | Fill #1

## 2016-08-11 DIAGNOSIS — Z0000 Encounter for general adult medical examination without abnormal findings: Secondary | ICD-10-CM | POA: Diagnosis not present

## 2016-08-11 DIAGNOSIS — D492 Neoplasm of unspecified behavior of bone, soft tissue, and skin: Secondary | ICD-10-CM | POA: Diagnosis not present

## 2016-08-11 DIAGNOSIS — E049 Nontoxic goiter, unspecified: Secondary | ICD-10-CM | POA: Diagnosis not present

## 2016-08-11 DIAGNOSIS — Z6824 Body mass index (BMI) 24.0-24.9, adult: Secondary | ICD-10-CM | POA: Diagnosis not present

## 2016-08-11 DIAGNOSIS — F419 Anxiety disorder, unspecified: Secondary | ICD-10-CM | POA: Diagnosis not present

## 2016-08-12 ENCOUNTER — Other Ambulatory Visit: Payer: Self-pay | Admitting: Internal Medicine

## 2016-08-12 DIAGNOSIS — R51 Headache: Secondary | ICD-10-CM | POA: Diagnosis not present

## 2016-08-12 DIAGNOSIS — M9902 Segmental and somatic dysfunction of thoracic region: Secondary | ICD-10-CM | POA: Diagnosis not present

## 2016-08-12 DIAGNOSIS — E049 Nontoxic goiter, unspecified: Secondary | ICD-10-CM

## 2016-08-12 DIAGNOSIS — M9901 Segmental and somatic dysfunction of cervical region: Secondary | ICD-10-CM | POA: Diagnosis not present

## 2016-08-15 ENCOUNTER — Ambulatory Visit
Admission: RE | Admit: 2016-08-15 | Discharge: 2016-08-15 | Disposition: A | Discharge status: Home / Self Care | Payer: 59 | Source: Ambulatory Visit | Attending: Internal Medicine | Admitting: Internal Medicine

## 2016-08-15 DIAGNOSIS — E042 Nontoxic multinodular goiter: Secondary | ICD-10-CM | POA: Diagnosis not present

## 2016-08-15 DIAGNOSIS — E049 Nontoxic goiter, unspecified: Secondary | ICD-10-CM

## 2016-09-01 DIAGNOSIS — R51 Headache: Secondary | ICD-10-CM | POA: Diagnosis not present

## 2016-09-01 DIAGNOSIS — M9901 Segmental and somatic dysfunction of cervical region: Secondary | ICD-10-CM | POA: Diagnosis not present

## 2016-09-01 DIAGNOSIS — M9902 Segmental and somatic dysfunction of thoracic region: Secondary | ICD-10-CM | POA: Diagnosis not present

## 2016-10-05 ENCOUNTER — Telehealth: Payer: 59 | Admitting: Family

## 2016-10-05 DIAGNOSIS — B372 Candidiasis of skin and nail: Secondary | ICD-10-CM | POA: Diagnosis not present

## 2016-10-05 MED ORDER — NYSTATIN 100000 UNIT/GM EX CREA: 1.0000 "application " | TOPICAL_CREAM | Freq: Two times a day (BID) | CUTANEOUS | 0 refills | Status: DC

## 2016-10-05 NOTE — Progress Notes (Signed)
E Visit for Rash  We are sorry that you are not feeling well. Here is how we plan to help!    Based upon your presentation it appears you have a fungal infection.  I have prescribed: and Nystatin cream apply to the affected area twice daily   HOME CARE:   Take cool showers and avoid direct sunlight.  Apply cool compress or wet dressings.  Take a bath in an oatmeal bath.  Sprinkle content of one Aveeno packet under running faucet with comfortably warm water.  Bathe for 15-20 minutes, 1-2 times daily.  Pat dry with a towel. Do not rub the rash.  Use hydrocortisone cream.  Take an antihistamine like Benadryl for widespread rashes that itch.  The adult dose of Benadryl is 25-50 mg by mouth 4 times daily.  Caution:  This type of medication may cause sleepiness.  Do not drink alcohol, drive, or operate dangerous machinery while taking antihistamines.  Do not take these medications if you have prostate enlargement.  Read package instructions thoroughly on all medications that you take.  GET HELP RIGHT AWAY IF:   Symptoms don't go away after treatment.  Severe itching that persists.  If you rash spreads or swells.  If you rash begins to smell.  If it blisters and opens or develops a yellow-brown crust.  You develop a fever.  You have a sore throat.  You become short of breath.  MAKE SURE YOU:  Understand these instructions. Will watch your condition. Will get help right away if you are not doing well or get worse.  Thank you for choosing an e-visit. Your e-visit answers were reviewed by a board certified advanced clinical practitioner to complete your personal care plan. Depending upon the condition, your plan could have included both over the counter or prescription medications. Please review your pharmacy choice. Be sure that the pharmacy you have chosen is open so that you can pick up your prescription now.  If there is a problem you may message your provider in MyChart  to have the prescription routed to another pharmacy. Your safety is important to us. If you have drug allergies check your prescription carefully.  For the next 24 hours, you can use MyChart to ask questions about today's visit, request a non-urgent call back, or ask for a work or school excuse from your e-visit provider. You will get an email in the next two days asking about your experience. I hope that your e-visit has been valuable and will speed your recovery.      

## 2016-10-06 MED FILL — NYSTATIN 100,000 UNIT/GM CR: 100000 | 30 days supply | Qty: 60 | Fill #0

## 2016-10-07 DIAGNOSIS — R51 Headache: Secondary | ICD-10-CM | POA: Diagnosis not present

## 2016-10-07 DIAGNOSIS — M9902 Segmental and somatic dysfunction of thoracic region: Secondary | ICD-10-CM | POA: Diagnosis not present

## 2016-10-07 DIAGNOSIS — M9901 Segmental and somatic dysfunction of cervical region: Secondary | ICD-10-CM | POA: Diagnosis not present

## 2016-10-10 ENCOUNTER — Telehealth: Payer: 59 | Admitting: Family

## 2016-10-10 DIAGNOSIS — R21 Rash and other nonspecific skin eruption: Secondary | ICD-10-CM | POA: Diagnosis not present

## 2016-10-10 DIAGNOSIS — L42 Pityriasis rosea: Secondary | ICD-10-CM | POA: Diagnosis not present

## 2016-10-10 MED ORDER — PREDNISONE 10 MG PO TABS: 10.0000 mg | ORAL_TABLET | Freq: Every day | ORAL | 0 refills | Status: DC

## 2016-10-10 MED FILL — TRIAMCINOLONE 0.1% CREAM: 0.1 | 30 days supply | Qty: 454 | Fill #0

## 2016-10-10 NOTE — Progress Notes (Signed)
E Visit for Rash  We are sorry that you are not feeling well. Here is how we plan to help!  Given that it is spreading, it may be arising from something inside your body rather than something on the surface. It could be a colonization of yeast if the cream is helping. Continue using the cream and we will add the prednisone. Let us know if it gets worse. If it spread more, you may need to be seen face-to-face.   I have prescribed Prednisone 10 mg daily for 5 days    HOME CARE:   Take cool showers and avoid direct sunlight.  Apply cool compress or wet dressings.  Take a bath in an oatmeal bath.  Sprinkle content of one Aveeno packet under running faucet with comfortably warm water.  Bathe for 15-20 minutes, 1-2 times daily.  Pat dry with a towel. Do not rub the rash.  Use hydrocortisone cream.  Take an antihistamine like Benadryl for widespread rashes that itch.  The adult dose of Benadryl is 25-50 mg by mouth 4 times daily.  Caution:  This type of medication may cause sleepiness.  Do not drink alcohol, drive, or operate dangerous machinery while taking antihistamines.  Do not take these medications if you have prostate enlargement.  Read package instructions thoroughly on all medications that you take.  GET HELP RIGHT AWAY IF:   Symptoms don't go away after treatment.  Severe itching that persists.  If you rash spreads or swells.  If you rash begins to smell.  If it blisters and opens or develops a yellow-brown crust.  You develop a fever.  You have a sore throat.  You become short of breath.  MAKE SURE YOU:  Understand these instructions. Will watch your condition. Will get help right away if you are not doing well or get worse.  Thank you for choosing an e-visit. Your e-visit answers were reviewed by a board certified advanced clinical practitioner to complete your personal care plan. Depending upon the condition, your plan could have included both over the counter  or prescription medications. Please review your pharmacy choice. Be sure that the pharmacy you have chosen is open so that you can pick up your prescription now.  If there is a problem you may message your provider in [place] to have the prescription routed to another pharmacy. Your safety is important to [place]. If you have drug allergies check your prescription carefully.  For the next 24 hours, you can use MyChart to ask questions about today's visit, request a non-urgent call back, or ask for a work or school excuse from your e-visit provider. You will get an email in the next two days asking about your experience. I hope that your e-visit has been valuable and will speed your recovery.     Marland Kitchen

## 2016-10-27 MED FILL — LEVOTHYROXINE 25 MCG TABLET: 25 | 90 days supply | Qty: 90 | Fill #0

## 2016-11-05 DIAGNOSIS — N911 Secondary amenorrhea: Secondary | ICD-10-CM | POA: Diagnosis not present

## 2016-11-06 DIAGNOSIS — R51 Headache: Secondary | ICD-10-CM | POA: Diagnosis not present

## 2016-11-06 DIAGNOSIS — M9901 Segmental and somatic dysfunction of cervical region: Secondary | ICD-10-CM | POA: Diagnosis not present

## 2016-11-06 DIAGNOSIS — M9902 Segmental and somatic dysfunction of thoracic region: Secondary | ICD-10-CM | POA: Diagnosis not present

## 2016-11-11 DIAGNOSIS — Z3685 Encounter for antenatal screening for Streptococcus B: Secondary | ICD-10-CM | POA: Diagnosis not present

## 2016-11-11 DIAGNOSIS — Z3481 Encounter for supervision of other normal pregnancy, first trimester: Secondary | ICD-10-CM | POA: Diagnosis not present

## 2016-11-11 LAB — OB RESULTS CONSOLE ANTIBODY SCREEN: Antibody Screen: NEGATIVE

## 2016-11-11 LAB — OB RESULTS CONSOLE ABO/RH: RH Type: POSITIVE

## 2016-11-11 LAB — OB RESULTS CONSOLE HEPATITIS B SURFACE ANTIGEN: Hepatitis B Surface Ag: NEGATIVE

## 2016-11-11 LAB — OB RESULTS CONSOLE RPR: RPR: NONREACTIVE

## 2016-11-11 LAB — OB RESULTS CONSOLE RUBELLA ANTIBODY, IGM: RUBELLA: IMMUNE

## 2016-11-11 LAB — OB RESULTS CONSOLE HIV ANTIBODY (ROUTINE TESTING): HIV: NONREACTIVE

## 2016-11-21 DIAGNOSIS — Z113 Encounter for screening for infections with a predominantly sexual mode of transmission: Secondary | ICD-10-CM | POA: Diagnosis not present

## 2016-11-21 DIAGNOSIS — Z3400 Encounter for supervision of normal first pregnancy, unspecified trimester: Secondary | ICD-10-CM | POA: Diagnosis not present

## 2016-11-21 DIAGNOSIS — Z01419 Encounter for gynecological examination (general) (routine) without abnormal findings: Secondary | ICD-10-CM | POA: Diagnosis not present

## 2016-11-21 LAB — HM PAP SMEAR: HM PAP: NEGATIVE

## 2016-11-21 LAB — OB RESULTS CONSOLE GC/CHLAMYDIA
Chlamydia: NEGATIVE
Gonorrhea: NEGATIVE

## 2016-12-08 DIAGNOSIS — Z363 Encounter for antenatal screening for malformations: Secondary | ICD-10-CM | POA: Diagnosis not present

## 2016-12-08 DIAGNOSIS — Z3491 Encounter for supervision of normal pregnancy, unspecified, first trimester: Secondary | ICD-10-CM | POA: Diagnosis not present

## 2016-12-08 DIAGNOSIS — Z36 Encounter for antenatal screening for chromosomal anomalies: Secondary | ICD-10-CM | POA: Diagnosis not present

## 2016-12-11 DIAGNOSIS — M9902 Segmental and somatic dysfunction of thoracic region: Secondary | ICD-10-CM | POA: Diagnosis not present

## 2016-12-11 DIAGNOSIS — M9901 Segmental and somatic dysfunction of cervical region: Secondary | ICD-10-CM | POA: Diagnosis not present

## 2016-12-11 DIAGNOSIS — R51 Headache: Secondary | ICD-10-CM | POA: Diagnosis not present

## 2017-01-02 DIAGNOSIS — H5212 Myopia, left eye: Secondary | ICD-10-CM | POA: Diagnosis not present

## 2017-01-06 DIAGNOSIS — M9901 Segmental and somatic dysfunction of cervical region: Secondary | ICD-10-CM | POA: Diagnosis not present

## 2017-01-06 DIAGNOSIS — M9902 Segmental and somatic dysfunction of thoracic region: Secondary | ICD-10-CM | POA: Diagnosis not present

## 2017-01-06 DIAGNOSIS — R51 Headache: Secondary | ICD-10-CM | POA: Diagnosis not present

## 2017-01-20 DIAGNOSIS — Z3492 Encounter for supervision of normal pregnancy, unspecified, second trimester: Secondary | ICD-10-CM | POA: Diagnosis not present

## 2017-01-20 DIAGNOSIS — Z363 Encounter for antenatal screening for malformations: Secondary | ICD-10-CM | POA: Diagnosis not present

## 2017-01-20 DIAGNOSIS — Z3480 Encounter for supervision of other normal pregnancy, unspecified trimester: Secondary | ICD-10-CM | POA: Diagnosis not present

## 2017-01-20 DIAGNOSIS — Z3A18 18 weeks gestation of pregnancy: Secondary | ICD-10-CM | POA: Diagnosis not present

## 2017-01-28 MED FILL — LEVOTHYROXINE 25 MCG TABLET: 25 | 90 days supply | Qty: 90 | Fill #1

## 2017-02-03 DIAGNOSIS — R51 Headache: Secondary | ICD-10-CM | POA: Diagnosis not present

## 2017-02-03 DIAGNOSIS — M9901 Segmental and somatic dysfunction of cervical region: Secondary | ICD-10-CM | POA: Diagnosis not present

## 2017-02-03 DIAGNOSIS — M9902 Segmental and somatic dysfunction of thoracic region: Secondary | ICD-10-CM | POA: Diagnosis not present

## 2017-02-17 DIAGNOSIS — Z3A22 22 weeks gestation of pregnancy: Secondary | ICD-10-CM | POA: Diagnosis not present

## 2017-02-17 DIAGNOSIS — Z362 Encounter for other antenatal screening follow-up: Secondary | ICD-10-CM | POA: Diagnosis not present

## 2017-02-17 DIAGNOSIS — Z3400 Encounter for supervision of normal first pregnancy, unspecified trimester: Secondary | ICD-10-CM | POA: Diagnosis not present

## 2017-03-04 DIAGNOSIS — R51 Headache: Secondary | ICD-10-CM | POA: Diagnosis not present

## 2017-03-04 DIAGNOSIS — M9901 Segmental and somatic dysfunction of cervical region: Secondary | ICD-10-CM | POA: Diagnosis not present

## 2017-03-04 DIAGNOSIS — M9902 Segmental and somatic dysfunction of thoracic region: Secondary | ICD-10-CM | POA: Diagnosis not present

## 2017-03-23 DIAGNOSIS — Z23 Encounter for immunization: Secondary | ICD-10-CM | POA: Diagnosis not present

## 2017-03-23 DIAGNOSIS — E049 Nontoxic goiter, unspecified: Secondary | ICD-10-CM | POA: Diagnosis not present

## 2017-03-23 DIAGNOSIS — Z3400 Encounter for supervision of normal first pregnancy, unspecified trimester: Secondary | ICD-10-CM | POA: Diagnosis not present

## 2017-03-23 DIAGNOSIS — Z3480 Encounter for supervision of other normal pregnancy, unspecified trimester: Secondary | ICD-10-CM | POA: Diagnosis not present

## 2017-04-09 DIAGNOSIS — R51 Headache: Secondary | ICD-10-CM | POA: Diagnosis not present

## 2017-04-09 DIAGNOSIS — M9902 Segmental and somatic dysfunction of thoracic region: Secondary | ICD-10-CM | POA: Diagnosis not present

## 2017-04-09 DIAGNOSIS — M9901 Segmental and somatic dysfunction of cervical region: Secondary | ICD-10-CM | POA: Diagnosis not present

## 2017-04-27 MED FILL — LEVOTHYROXINE 25 MCG TABLET: 25 | 30 days supply | Qty: 30 | Fill #0

## 2017-05-14 DIAGNOSIS — M9902 Segmental and somatic dysfunction of thoracic region: Secondary | ICD-10-CM | POA: Diagnosis not present

## 2017-05-14 DIAGNOSIS — M9901 Segmental and somatic dysfunction of cervical region: Secondary | ICD-10-CM | POA: Diagnosis not present

## 2017-05-14 DIAGNOSIS — R51 Headache: Secondary | ICD-10-CM | POA: Diagnosis not present

## 2017-05-19 DIAGNOSIS — Z3480 Encounter for supervision of other normal pregnancy, unspecified trimester: Secondary | ICD-10-CM | POA: Diagnosis not present

## 2017-05-25 MED FILL — LEVOTHYROXINE 25 MCG TABLET: 25 | 90 days supply | Qty: 90 | Fill #1

## 2017-05-31 LAB — OB RESULTS CONSOLE GBS: STREP GROUP B AG: NEGATIVE

## 2017-06-16 ENCOUNTER — Inpatient Hospital Stay (HOSPITAL_COMMUNITY): Payer: 59 | Admitting: Anesthesiology

## 2017-06-16 ENCOUNTER — Inpatient Hospital Stay (HOSPITAL_COMMUNITY)
Admission: AD | Admit: 2017-06-16 | Discharge: 2017-06-18 | DRG: 807 | Disposition: A | Discharge status: Home / Self Care | Payer: 59 | Source: Ambulatory Visit | Attending: Obstetrics and Gynecology | Admitting: Obstetrics and Gynecology

## 2017-06-16 ENCOUNTER — Encounter (HOSPITAL_COMMUNITY): Payer: Self-pay | Admitting: *Deleted

## 2017-06-16 DIAGNOSIS — O99284 Endocrine, nutritional and metabolic diseases complicating childbirth: Secondary | ICD-10-CM | POA: Diagnosis present

## 2017-06-16 DIAGNOSIS — E042 Nontoxic multinodular goiter: Secondary | ICD-10-CM | POA: Diagnosis present

## 2017-06-16 DIAGNOSIS — O4292 Full-term premature rupture of membranes, unspecified as to length of time between rupture and onset of labor: Secondary | ICD-10-CM | POA: Diagnosis not present

## 2017-06-16 DIAGNOSIS — E039 Hypothyroidism, unspecified: Secondary | ICD-10-CM | POA: Diagnosis present

## 2017-06-16 DIAGNOSIS — Z3A39 39 weeks gestation of pregnancy: Secondary | ICD-10-CM

## 2017-06-16 DIAGNOSIS — O4290 Premature rupture of membranes, unspecified as to length of time between rupture and onset of labor, unspecified weeks of gestation: Secondary | ICD-10-CM | POA: Diagnosis present

## 2017-06-16 DIAGNOSIS — O26893 Other specified pregnancy related conditions, third trimester: Secondary | ICD-10-CM | POA: Diagnosis present

## 2017-06-16 DIAGNOSIS — O42013 Preterm premature rupture of membranes, onset of labor within 24 hours of rupture, third trimester: Secondary | ICD-10-CM

## 2017-06-16 HISTORY — DX: Nontoxic goiter, unspecified: E04.9

## 2017-06-16 LAB — ABO/RH: ABO/RH(D): O POS

## 2017-06-16 LAB — CBC
HEMATOCRIT: 36.4 % (ref 36.0–46.0)
HEMOGLOBIN: 12.2 g/dL (ref 12.0–15.0)
MCH: 29.5 pg (ref 26.0–34.0)
MCHC: 33.5 g/dL (ref 30.0–36.0)
MCV: 88.1 fL (ref 78.0–100.0)
Platelets: 224 10*3/uL (ref 150–400)
RBC: 4.13 MIL/uL (ref 3.87–5.11)
RDW: 14 % (ref 11.5–15.5)
WBC: 10 10*3/uL (ref 4.0–10.5)

## 2017-06-16 LAB — TYPE AND SCREEN
ABO/RH(D): O POS
Antibody Screen: NEGATIVE

## 2017-06-16 LAB — POCT FERN TEST: POCT Fern Test: POSITIVE

## 2017-06-16 MED ORDER — LACTATED RINGERS IV SOLN: 500.0000 mL | INTRAVENOUS | Status: DC | PRN

## 2017-06-16 MED ORDER — PHENYLEPHRINE 40 MCG/ML (10ML) SYRINGE FOR IV PUSH (FOR BLOOD PRESSURE SUPPORT)
80.0000 ug | PREFILLED_SYRINGE | INTRAVENOUS | Status: DC | PRN
  Administered 2017-06-16: 80 ug via INTRAVENOUS
  Filled 2017-06-16: qty 10
  Filled 2017-06-16: qty 5

## 2017-06-16 MED ORDER — OXYCODONE HCL 5 MG PO TABS: 5.0000 mg | ORAL_TABLET | ORAL | Status: DC | PRN

## 2017-06-16 MED ORDER — LACTATED RINGERS IV SOLN
INTRAVENOUS | Status: DC
  Administered 2017-06-16: 125 mL/h via INTRAVENOUS
  Administered 2017-06-16: 20:00:00 via INTRAVENOUS

## 2017-06-16 MED ORDER — ONDANSETRON HCL 4 MG/2ML IJ SOLN: 4.0000 mg | Freq: Four times a day (QID) | INTRAMUSCULAR | Status: DC | PRN

## 2017-06-16 MED ORDER — DIPHENHYDRAMINE HCL 50 MG/ML IJ SOLN: 12.5000 mg | INTRAMUSCULAR | Status: DC | PRN

## 2017-06-16 MED ORDER — ONDANSETRON HCL 4 MG PO TABS
4.0000 mg | ORAL_TABLET | ORAL | Status: DC | PRN
Start: 2017-06-16 — End: 2017-06-18

## 2017-06-16 MED ORDER — ZOLPIDEM TARTRATE 5 MG PO TABS: 5.0000 mg | ORAL_TABLET | Freq: Every evening | ORAL | Status: DC | PRN

## 2017-06-16 MED ORDER — SOD CITRATE-CITRIC ACID 500-334 MG/5ML PO SOLN: 30.0000 mL | ORAL | Status: DC | PRN

## 2017-06-16 MED ORDER — BENZOCAINE-MENTHOL 20-0.5 % EX AERO
1.0000 "application " | INHALATION_SPRAY | CUTANEOUS | Status: DC | PRN
  Administered 2017-06-17: 1 via TOPICAL
  Filled 2017-06-16: qty 56

## 2017-06-16 MED ORDER — OXYTOCIN BOLUS FROM INFUSION
500.0000 mL | Freq: Once | INTRAVENOUS | Status: AC
  Administered 2017-06-16: 500 mL via INTRAVENOUS

## 2017-06-16 MED ORDER — BUTORPHANOL TARTRATE 1 MG/ML IJ SOLN: 1.0000 mg | INTRAMUSCULAR | Status: DC | PRN

## 2017-06-16 MED ORDER — FLEET ENEMA 7-19 GM/118ML RE ENEM: 1.0000 | ENEMA | RECTAL | Status: DC | PRN

## 2017-06-16 MED ORDER — WITCH HAZEL-GLYCERIN EX PADS: 1.0000 "application " | MEDICATED_PAD | CUTANEOUS | Status: DC | PRN

## 2017-06-16 MED ORDER — ACETAMINOPHEN 325 MG PO TABS
650.0000 mg | ORAL_TABLET | ORAL | Status: DC | PRN
Start: 2017-06-16 — End: 2017-06-18

## 2017-06-16 MED ORDER — COCONUT OIL OIL
1.0000 "application " | TOPICAL_OIL | Status: DC | PRN
  Administered 2017-06-17: 1 via TOPICAL
  Filled 2017-06-16: qty 120

## 2017-06-16 MED ORDER — EPHEDRINE 5 MG/ML INJ
10.0000 mg | INTRAVENOUS | Status: DC | PRN
  Filled 2017-06-16: qty 2

## 2017-06-16 MED ORDER — OXYCODONE-ACETAMINOPHEN 5-325 MG PO TABS: 1.0000 | ORAL_TABLET | ORAL | Status: DC | PRN

## 2017-06-16 MED ORDER — LIDOCAINE HCL (PF) 1 % IJ SOLN
30.0000 mL | INTRAMUSCULAR | Status: DC | PRN
  Administered 2017-06-16: 30 mL via SUBCUTANEOUS
  Filled 2017-06-16: qty 30

## 2017-06-16 MED ORDER — DIPHENHYDRAMINE HCL 25 MG PO CAPS: 25.0000 mg | ORAL_CAPSULE | Freq: Four times a day (QID) | ORAL | Status: DC | PRN

## 2017-06-16 MED ORDER — LACTATED RINGERS IV SOLN
500.0000 mL | Freq: Once | INTRAVENOUS | Status: AC
  Administered 2017-06-16: 500 mL via INTRAVENOUS

## 2017-06-16 MED ORDER — LIDOCAINE HCL (PF) 1 % IJ SOLN
INTRAMUSCULAR | Status: DC | PRN
  Administered 2017-06-16 (×2): 5 mL via EPIDURAL

## 2017-06-16 MED ORDER — OXYTOCIN 40 UNITS IN LACTATED RINGERS INFUSION - SIMPLE MED
2.5000 [IU]/h | INTRAVENOUS | Status: DC
  Filled 2017-06-16: qty 1000

## 2017-06-16 MED ORDER — TETANUS-DIPHTH-ACELL PERTUSSIS 5-2.5-18.5 LF-MCG/0.5 IM SUSP: 0.5000 mL | Freq: Once | INTRAMUSCULAR | Status: DC

## 2017-06-16 MED ORDER — DIBUCAINE 1 % RE OINT: 1.0000 "application " | TOPICAL_OINTMENT | RECTAL | Status: DC | PRN

## 2017-06-16 MED ORDER — PHENYLEPHRINE 40 MCG/ML (10ML) SYRINGE FOR IV PUSH (FOR BLOOD PRESSURE SUPPORT)
80.0000 ug | PREFILLED_SYRINGE | INTRAVENOUS | Status: AC | PRN
  Administered 2017-06-16 (×3): 80 ug via INTRAVENOUS

## 2017-06-16 MED ORDER — OXYCODONE-ACETAMINOPHEN 5-325 MG PO TABS: 2.0000 | ORAL_TABLET | ORAL | Status: DC | PRN

## 2017-06-16 MED ORDER — PRENATAL MULTIVITAMIN CH
1.0000 | ORAL_TABLET | Freq: Every day | ORAL | Status: DC
  Administered 2017-06-17: 1 via ORAL
  Filled 2017-06-16: qty 1

## 2017-06-16 MED ORDER — FENTANYL 2.5 MCG/ML BUPIVACAINE 1/10 % EPIDURAL INFUSION (WH - ANES)
14.0000 mL/h | INTRAMUSCULAR | Status: DC | PRN
  Administered 2017-06-16: 14 mL/h via EPIDURAL
  Filled 2017-06-16: qty 100

## 2017-06-16 MED ORDER — IBUPROFEN 600 MG PO TABS
600.0000 mg | ORAL_TABLET | Freq: Four times a day (QID) | ORAL | Status: DC
  Administered 2017-06-17 – 2017-06-18 (×5): 600 mg via ORAL
  Filled 2017-06-16 (×6): qty 1

## 2017-06-16 MED ORDER — EPHEDRINE 5 MG/ML INJ
10.0000 mg | INTRAVENOUS | Status: DC | PRN
  Administered 2017-06-16: 10 mg via INTRAVENOUS
  Filled 2017-06-16: qty 2

## 2017-06-16 MED ORDER — ACETAMINOPHEN 325 MG PO TABS: 650.0000 mg | ORAL_TABLET | ORAL | Status: DC | PRN

## 2017-06-16 MED ORDER — SENNOSIDES-DOCUSATE SODIUM 8.6-50 MG PO TABS
2.0000 | ORAL_TABLET | ORAL | Status: DC
  Administered 2017-06-17 – 2017-06-18 (×2): 2 via ORAL
  Filled 2017-06-16 (×2): qty 2

## 2017-06-16 MED ORDER — OXYCODONE HCL 5 MG PO TABS: 10.0000 mg | ORAL_TABLET | ORAL | Status: DC | PRN

## 2017-06-16 MED ORDER — SIMETHICONE 80 MG PO CHEW: 80.0000 mg | CHEWABLE_TABLET | ORAL | Status: DC | PRN

## 2017-06-16 MED ORDER — ONDANSETRON HCL 4 MG/2ML IJ SOLN: 4.0000 mg | INTRAMUSCULAR | Status: DC | PRN

## 2017-06-16 NOTE — H&P (Signed)
Jacqueline Chen is a 31 y.o. female presenting for SROM about 5:30 pm . Now feeling UCs. Pregnancy complicated by multinodular goiter and hypothyroidism. OB History    Gravida Para Term Preterm AB Living   2 1 1     1    SAB TAB Ectopic Multiple Live Births           1     No past medical history on file. No past surgical history on file. Family History: family history is not on file. Social History:  reports that she has never smoked. She has never used smokeless tobacco. She reports that she does not drink alcohol or use drugs.     Maternal Diabetes: No Genetic Screening: Normal Maternal Ultrasounds/Referrals: Normal Fetal Ultrasounds or other Referrals:  None Maternal Substance Abuse:  No Significant Maternal Medications:  Meds include: Syntroid Significant Maternal Lab Results:  None Other Comments:  None  Review of Systems  Eyes: Negative for blurred vision.  Gastrointestinal: Negative for abdominal pain.  Neurological: Negative for headaches.   Maternal Medical History:  Reason for admission: Rupture of membranes and contractions.   Contractions: Onset was 1-2 hours ago.    Fetal activity: Perceived fetal activity is normal.      Dilation: 2 Effacement (%): 50 Station: -3 Exam by:: Jacqueline Chen Blood pressure 116/72, pulse (!) 120, temperature 98.2 F (36.8 C), resp. rate 18, height 5\' 7"  (1.702 m), weight 196 lb (88.9 kg), SpO2 100 %, unknown if currently breastfeeding. Maternal Exam:  Uterine Assessment: Contraction strength is moderate.  Contraction frequency is regular.   Abdomen: Patient reports no abdominal tenderness.   Fetal Exam Fetal State Assessment: Category I - tracings are normal.     Physical Exam  Cardiovascular: Normal rate and regular rhythm.   Respiratory: Effort normal and breath sounds normal.  GI: Soft. There is no tenderness.  Neurological: She has normal reflexes.    Prenatal labs: ABO, Rh: O/Positive/-- (02/27  0000) Antibody: Negative (02/27 0000) Rubella: @RUBELLARESULTSCONSOLE @ RPR: Nonreactive (02/27 0000)  HBsAg: Negative (02/27 0000)  HIV: Non-reactive (02/27 0000)  GBS: Negative (09/16 0000)   Assessment/Plan: 31 yo G2P1 @ 39 5/7 weeks with SROM entering labor   Jacqueline Chen,Jacqueline Chen 06/16/2017, 7:03 PM

## 2017-06-16 NOTE — Progress Notes (Signed)
Delivery Note At 9:17 PM a viable female was delivered via Vaginal, Spontaneous Delivery (Presentation: ;  ).  APGAR: , ; weight  .   Placenta status: , .  Cord:  with the following complications: .  Cord pH: pending  Anesthesia:   Episiotomy: None Lacerations: 2nd degree;Perineal Suture Repair: 2.0 vicryl rapide Est. Blood Loss (mL):  250  Mom to postpartum.  Baby to Couplet care / Skin to Skin.  Shanan Mcmiller II,Shogo Larkey E 06/16/2017, 9:34 PM

## 2017-06-16 NOTE — MAU Note (Signed)
Pt reports ROM 1730, contractions

## 2017-06-16 NOTE — Anesthesia Procedure Notes (Signed)
Epidural Patient location during procedure: OB Start time: 06/16/2017 7:53 PM End time: 06/16/2017 8:20 PM  Staffing Anesthesiologist: Duane Boston Performed: anesthesiologist   Preanesthetic Checklist Completed: patient identified, site marked, pre-op evaluation, timeout performed, IV checked, risks and benefits discussed and monitors and equipment checked  Epidural Patient position: sitting Prep: DuraPrep Patient monitoring: heart rate, cardiac monitor, continuous pulse ox and blood pressure Approach: midline Location: L2-L3 Injection technique: LOR saline  Needle:  Needle type: Tuohy  Needle gauge: 17 G Needle length: 9 cm Needle insertion depth: 6 cm Catheter size: 20 Guage Catheter at skin depth: 11 cm Test dose: negative and Other  Assessment Events: blood not aspirated, injection not painful, no injection resistance and negative IV test  Additional Notes Informed consent obtained prior to proceeding including risk of failure, 1% risk of PDPH, risk of minor discomfort and bruising.  Discussed rare but serious complications including epidural abscess, permanent nerve injury, epidural hematoma.  Discussed alternatives to epidural analgesia and patient desires to proceed.  Timeout performed pre-procedure verifying patient name, procedure, and platelet count.  Patient tolerated procedure well.

## 2017-06-16 NOTE — Anesthesia Preprocedure Evaluation (Signed)
Anesthesia Evaluation  Patient identified by MRN, date of birth, ID band Patient awake    Reviewed: Allergy & Precautions, H&P , NPO status , Patient's Chart, lab work & pertinent test results  Airway Mallampati: I  TM Distance: >3 FB Neck ROM: full    Dental no notable dental hx.    Pulmonary neg pulmonary ROS,    Pulmonary exam normal        Cardiovascular negative cardio ROS Normal cardiovascular exam     Neuro/Psych negative neurological ROS  negative psych ROS   GI/Hepatic negative GI ROS, Neg liver ROS,   Endo/Other  negative endocrine ROS  Renal/GU negative Renal ROS     Musculoskeletal   Abdominal Normal abdominal exam  (+)   Peds  Hematology negative hematology ROS (+)   Anesthesia Other Findings   Reproductive/Obstetrics (+) Pregnancy                             Anesthesia Physical  Anesthesia Plan  ASA: II  Anesthesia Plan: Epidural   Post-op Pain Management:    Induction:   PONV Risk Score and Plan:   Airway Management Planned:   Additional Equipment:   Intra-op Plan:   Post-operative Plan:   Informed Consent: I have reviewed the patients History and Physical, chart, labs and discussed the procedure including the risks, benefits and alternatives for the proposed anesthesia with the patient or authorized representative who has indicated his/her understanding and acceptance.   Dental advisory given  Plan Discussed with: Anesthesiologist  Anesthesia Plan Comments:         Anesthesia Quick Evaluation

## 2017-06-17 LAB — CBC
HCT: 34.2 % — ABNORMAL LOW (ref 36.0–46.0)
Hemoglobin: 11.5 g/dL — ABNORMAL LOW (ref 12.0–15.0)
MCH: 29.6 pg (ref 26.0–34.0)
MCHC: 33.6 g/dL (ref 30.0–36.0)
MCV: 87.9 fL (ref 78.0–100.0)
PLATELETS: 193 10*3/uL (ref 150–400)
RBC: 3.89 MIL/uL (ref 3.87–5.11)
RDW: 14 % (ref 11.5–15.5)
WBC: 12.3 10*3/uL — AB (ref 4.0–10.5)

## 2017-06-17 LAB — RPR: RPR: NONREACTIVE

## 2017-06-17 NOTE — Lactation Note (Signed)
This note was copied from a baby's chart. Lactation Consultation Note  Patient Name: Jacqueline Chen Today's Date: 06/17/2017 Reason for consult: Initial assessment   Provided [employer] Employee with [employer] pump.    Maternal Data Formula Feeding for Exclusion: No Has patient been taught Hand Expression?: Yes Does the patient have breastfeeding experience prior to this delivery?: Yes  Feeding Feeding Type: Breast Milk  LATCH Score                   Interventions    Lactation Tools Discussed/Used WIC Program: No   Consult Status Consult Status: Follow-up Date: 06/18/17 Follow-up type: In-patient    Vivianne Master [facility] 06/17/2017, 12:20 PM

## 2017-06-17 NOTE — Lactation Note (Signed)
This note was copied from a baby's chart. Lactation Consultation Note  Patient Name: Jacqueline Chen [id number] Date: 06/17/2017 Reason for consult: Follow-up assessment    Follow-up at 2 hrs old; mom called for assistance with latch as LC had asked mom to do earlier today since baby has been very sleepy since 1am.   Infant STS with mom when [place] entered room; mom trying to get infant to latch in a cradle hold but infant too sleepy. LC suggested switching to cross-cradle, sandwiching breast, and hand expressing colostrum into infant's mouth.  Mom independently hand expressed colostrum to put in infant's mouth.   Infant still would not awaken for feeding.  Reviewed basics of breastfeeding and positioning for optimal nipple placement in infant's mouth to elicit sucking reflex and milk transfer.   Encouraged keeping infant STS for next several minutes and watching for feeding cues.   Encouraged if infant does not awaken in next hour for feeding then to hand express and spoon feed.     Maternal Data Formula Feeding for Exclusion: No Has patient been taught Hand Expression?: Yes Does the patient have breastfeeding experience prior to this delivery?: Yes  Feeding Feeding Type: Breast Fed  LATCH Score Latch: Too sleepy or reluctant, no latch achieved, no sucking elicited.     Type of Nipple: Everted at rest and after stimulation  Comfort (Breast/Nipple): Soft / non-tender  Hold (Positioning): Assistance needed to correctly position infant at breast and maintain latch.     Interventions Interventions: Assisted with latch;Breast feeding basics reviewed;Skin to skin;Hand express;Expressed milk  Lactation Tools Discussed/Used WIC Program: No   Consult Status Consult Status: Follow-up Date: 06/18/17 Follow-up type: In-patient    Merlene Laughter 06/17/2017, 12:50 PM

## 2017-06-17 NOTE — Anesthesia Postprocedure Evaluation (Signed)
Anesthesia Post Note  Patient: Jacqueline Chen  Procedure(s) Performed: AN AD HOC LABOR EPIDURAL     Patient location during evaluation: Mother Baby Anesthesia Type: Epidural Level of consciousness: awake and alert Pain management: pain level controlled Vital Signs Assessment: post-procedure vital signs reviewed and stable Respiratory status: spontaneous breathing, nonlabored ventilation and respiratory function stable Cardiovascular status: stable Postop Assessment: no headache, no backache and epidural receding Anesthetic complications: no    Last Vitals:  Vitals:   06/17/17 0029 06/17/17 0549  BP: (!) 105/54 (!) 94/58  Pulse: 96 79  Resp: 18 18  Temp: 36.9 C 36.8 C  SpO2:      Last Pain:  Vitals:   06/17/17 0549  TempSrc: Oral  PainSc: 0-No pain   Pain Goal:                 [employer]

## 2017-06-17 NOTE — Progress Notes (Signed)
Post Partum Day 1 Subjective: no complaints, up ad lib, voiding and tolerating PO  Objective: Blood pressure (!) 94/58, pulse 79, temperature 98.3 F (36.8 C), temperature source Oral, resp. rate 18, height 5\' 7"  (1.702 m), weight 196 lb (88.9 kg), SpO2 100 %, unknown if currently breastfeeding.  Physical Exam:  General: alert, cooperative, appears stated age and no distress Lochia: appropriate Uterine Fundus: firm Incision: healing well DVT Evaluation: No evidence of DVT seen on physical exam.   Recent Labs  06/16/17 1905 06/17/17 0503  HGB 12.2 11.5*  HCT 36.4 34.2*    Assessment/Plan: Plan for discharge tomorrow and Breastfeeding   LOS: 1 day   Jeny Nield C 06/17/2017, 9:25 AM

## 2017-06-17 NOTE — Lactation Note (Signed)
This note was copied from a baby's chart. Lactation Consultation Note  Patient Name: Jacqueline Chen [id number] Date: 06/17/2017 Reason for consult: Initial assessment   Initial consult at 64 hrs old; Infant asleep not showing cues.  Mom to call [facility] for next feeding. Infant GA 39.4; BW 8 lbs, 12.6 oz.  Mom is [employer] and has received employee pump.   Mom has Hx of hypothyroidism on Synthroid.  P2 experience breastfeeding first child for 1 year Infant has breastfed x4 (15-45 min) + attempt x2 (0 min) + spoon x2 (drops) since birth 14 hrs ago; voids-1; stools-2; LS-9 by [employer]. Mom stated the first several feedings were very good and consistent but infant has been sleepy since then (1am) and has not been able to awaken for feeding (attempts and spoons since 1am). Reviewed continuing to feed with feeding cues and putting STS to arouse if infant has been sleeping for 2-3 hours; reviewed size of infant's stomach, and cluster feeding.   Encouraged mom to call at next feeding for [facility] consult.     Maternal Data Formula Feeding for Exclusion: No Has patient been taught Hand Expression?: Yes Does the patient have breastfeeding experience prior to this delivery?: Yes  Feeding Feeding Type: Breast Milk  LATCH Score                   Interventions    Lactation Tools Discussed/Used WIC Program: No   Consult Status Consult Status: Follow-up Date: 06/18/17 Follow-up type: In-patient    Merlene Laughter 06/17/2017, 12:00 PM

## 2017-06-18 NOTE — Lactation Note (Signed)
This note was copied from a baby's chart. Lactation Consultation Note  Patient Name: Jacqueline Chen [id number] Date: 06/18/2017 Reason for consult: Follow-up assessment   Follow up with mom of 74 hour old infant. Infant with 10 BF for 15-70 minutes, 3 BF attempts, 2 voids and 5 stools in the last 24 hours. LATCH Scores 9. Infant weight 8 lb 5.9 oz with 5% weight loss since birth.   Mom reports her breasts are feeling fuller and her nipples are painful with initial latch. Enc mom to apply EBM post BF and follow with coconut oil. Mom reports infant is cluster feeding.   Mom has [place] for home use. Reviewed I/O, Engorgement prevention/treatment and breast milk handling and storage. Mom has [place] phone # and is aware of OP services, and BF Support groups. Mom without questions/concerns at this time.   Maternal Data Formula Feeding for Exclusion: No Has patient been taught Hand Expression?: Yes Does the patient have breastfeeding experience prior to this delivery?: Yes  Feeding Feeding Type: Breast Fed Length of feed: 30 min  LATCH Score                   Interventions Interventions: Coconut oil  Lactation Tools Discussed/Used WIC Program: No Pump Review: Milk Storage   Consult Status Consult Status: Complete Follow-up type: Call as needed    Donn Pierini 06/18/2017, 8:55 AM

## 2017-06-18 NOTE — Discharge Summary (Signed)
Obstetric Discharge Summary Reason for Admission: rupture of membranes Prenatal Procedures: none Intrapartum Procedures: spontaneous vaginal delivery Postpartum Procedures: none Complications-Operative and Postpartum: 2nd degree perineal laceration Hemoglobin  Date Value Ref Range Status  06/17/2017 11.5 (L) 12.0 - 15.0 g/dL Final   HCT  Date Value Ref Range Status  06/17/2017 34.2 (L) 36.0 - 46.0 % Final    Physical Exam:  General: alert, cooperative and appears stated age 31: appropriate Uterine Fundus: firm Incision: healing well, no significant drainage, no dehiscence, no significant erythema DVT Evaluation: No evidence of DVT seen on physical exam.  Discharge Diagnoses: Term Pregnancy-delivered  Discharge Information: Date: 06/18/2017 Activity: pelvic rest Diet: routine Medications: Ibuprofen Condition: improved Instructions: refer to practice specific booklet Discharge to: home   Newborn Data: Live born female  Birth Weight: 8 lb 12.6 oz (3985 g) APGAR: 9, 9  Newborn Delivery   Birth date/time:  06/16/2017 21:17:00 Delivery type:  Vaginal, Spontaneous Delivery      Home with mother.  Claudius Mich L 06/18/2017, 8:40 AM

## 2017-06-24 ENCOUNTER — Inpatient Hospital Stay (HOSPITAL_COMMUNITY): Payer: 59

## 2017-07-28 DIAGNOSIS — Z1389 Encounter for screening for other disorder: Secondary | ICD-10-CM | POA: Diagnosis not present

## 2017-07-28 MED FILL — NORETHINDRONE 0.35 MG TAB: 0.35 | 28 days supply | Qty: 28 | Fill #0

## 2017-08-10 ENCOUNTER — Encounter: Payer: Self-pay | Admitting: Adult Health

## 2017-08-10 ENCOUNTER — Ambulatory Visit (INDEPENDENT_AMBULATORY_CARE_PROVIDER_SITE_OTHER): Payer: 59 | Admitting: Adult Health

## 2017-08-10 VITALS — BP 102/69 | HR 78 | Ht 67.0 in | Wt 174.8 lb

## 2017-08-10 DIAGNOSIS — Z0000 Encounter for general adult medical examination without abnormal findings: Secondary | ICD-10-CM | POA: Diagnosis not present

## 2017-08-10 NOTE — Patient Instructions (Addendum)
Heart-Healthy Eating Plan Many factors influence your heart health, including eating and exercise habits. Heart (coronary) risk increases with abnormal blood fat (lipid) levels. Heart-healthy meal planning includes limiting unhealthy fats, increasing healthy fats, and making other small dietary changes. This includes maintaining a healthy body weight to help keep lipid levels within a normal range. What is my plan? Your health care provider recommends that you:  Get no more than ___25__% of the total calories in your daily diet from fat.  Limit your intake of saturated fat to less than ___5___% of your total calories each day.  Limit the amount of cholesterol in your diet to less than __300__ mg per day.  What types of fat should I choose?  Choose healthy fats more often. Choose monounsaturated and polyunsaturated fats, such as olive oil and canola oil, flaxseeds, walnuts, almonds, and seeds.  Eat more omega-3 fats. Good choices include salmon, mackerel, sardines, tuna, flaxseed oil, and ground flaxseeds. Aim to eat fish at least two times each week.  Limit saturated fats. Saturated fats are primarily found in animal products, such as meats, butter, and cream. Plant sources of saturated fats include palm oil, palm kernel oil, and coconut oil.  Avoid foods with partially hydrogenated oils in them. These contain trans fats. Examples of foods that contain trans fats are stick margarine, some tub margarines, cookies, crackers, and other baked goods. What general guidelines do I need to follow?  Check food labels carefully to identify foods with trans fats or high amounts of saturated fat.  Fill one half of your plate with vegetables and green salads. Eat 4-5 servings of vegetables per day. A serving of vegetables equals 1 cup of raw leafy vegetables,  cup of raw or cooked cut-up vegetables, or  cup of vegetable juice.  Fill one fourth of your plate with whole grains. Look for the word "whole"  as the first word in the ingredient list.  Fill one fourth of your plate with lean protein foods.  Eat 4-5 servings of fruit per day. A serving of fruit equals one medium whole fruit,  cup of dried fruit,  cup of fresh, frozen, or canned fruit, or  cup of 100% fruit juice.  Eat more foods that contain soluble fiber. Examples of foods that contain this type of fiber are apples, broccoli, carrots, beans, peas, and barley. Aim to get 20-30 g of fiber per day.  Eat more home-cooked food and less restaurant, buffet, and fast food.  Limit or avoid alcohol.  Limit foods that are high in starch and sugar.  Avoid fried foods.  Cook foods by using methods other than frying. Baking, boiling, grilling, and broiling are all great options. Other fat-reducing suggestions include: ? Removing the skin from poultry. ? Removing all visible fats from meats. ? Skimming the fat off of stews, soups, and gravies before serving them. ? Steaming vegetables in water or broth.  Lose weight if you are overweight. Losing just 5-10% of your initial body weight can help your overall health and prevent diseases such as diabetes and heart disease.  Increase your consumption of nuts, legumes, and seeds to 4-5 servings per week. One serving of dried beans or legumes equals  cup after being cooked, one serving of nuts equals 1 ounces, and one serving of seeds equals  ounce or 1 tablespoon.  You may need to monitor your salt (sodium) intake, especially if you have high blood pressure. Talk with your health care provider or dietitian to get  more information about reducing sodium. What foods can I eat? Grains  Breads, including [place], white, pita, wheat, raisin, rye, oatmeal, and [place]. Tortillas that are neither fried nor made with lard or trans fat. Low-fat rolls, including hotdog and hamburger buns and English muffins. Biscuits. Muffins. Waffles. Pancakes. Light popcorn. Whole-grain cereals. Flatbread. Melba  toast. Pretzels. Breadsticks. Rusks. Low-fat snacks and crackers, including oyster, saltine, matzo, graham, animal, and rye. Rice and pasta, including brown rice and those that are made with whole wheat. Vegetables All vegetables. Fruits All fruits, but limit coconut. Meats and Other Protein Sources Lean, well-trimmed beef, veal, pork, and lamb. Chicken and [place] without skin. All fish and shellfish. Wild duck, rabbit, pheasant, and venison. Egg whites or low-cholesterol egg substitutes. Dried beans, peas, lentils, and tofu.Seeds and most nuts. Dairy Low-fat or nonfat cheeses, including ricotta, string, and mozzarella. Skim or 1% milk that is liquid, powdered, or evaporated. Buttermilk that is made with low-fat milk. Nonfat or low-fat yogurt. Beverages Mineral water. Diet carbonated beverages. Sweets and Desserts Sherbets and fruit ices. Honey, jam, marmalade, jelly, and syrups. Meringues and gelatins. Pure sugar candy, such as hard candy, jelly beans, gumdrops, mints, marshmallows, and small amounts of dark chocolate. [employer] Eat all sweets and desserts in moderation. Fats and Oils Nonhydrogenated (trans-free) margarines. Vegetable oils, including soybean, sesame, sunflower, olive, peanut, safflower, corn, canola, and cottonseed. Salad dressings or mayonnaise that are made with a vegetable oil. Limit added fats and oils that you use for cooking, baking, salads, and as spreads. Other Cocoa powder. Coffee and tea. All seasonings and condiments. The items listed above may not be a complete list of recommended foods or beverages. Contact your dietitian for more options. What foods are not recommended? Grains Breads that are made with saturated or trans fats, oils, or whole milk. Croissants. Butter rolls. Cheese breads. Sweet rolls. Donuts. Buttered popcorn. Chow mein noodles. High-fat crackers, such as cheese or butter crackers. Meats and Other Protein Sources Fatty meats, such as  hotdogs, short ribs, sausage, spareribs, bacon, ribeye roast or steak, and mutton. High-fat deli meats, such as salami and bologna. Caviar. Domestic duck and goose. Organ meats, such as kidney, liver, sweetbreads, brains, gizzard, chitterlings, and heart. Dairy Cream, sour cream, cream cheese, and creamed cottage cheese. Whole milk cheeses, including blue (bleu), Monterey Jack, [place], [place], American, [place], Swiss, [place], [place], and [place]. Whole or 2% milk that is liquid, evaporated, or condensed. Whole buttermilk. Cream sauce or high-fat cheese sauce. Yogurt that is made from whole milk. Beverages Regular sodas and drinks with added sugar. Sweets and Desserts Frosting. Pudding. Cookies. Cakes other than angel food cake. Candy that has milk chocolate or white chocolate, hydrogenated fat, butter, coconut, or unknown ingredients. Buttered syrups. Full-fat ice cream or ice cream drinks. Fats and Oils Gravy that has suet, meat fat, or shortening. Cocoa butter, hydrogenated oils, palm oil, coconut oil, palm kernel oil. These can often be found in baked products, candy, fried foods, nondairy creamers, and whipped toppings. Solid fats and shortenings, including bacon fat, salt pork, lard, and butter. Nondairy cream substitutes, such as coffee creamers and sour cream substitutes. Salad dressings that are made of unknown oils, cheese, or sour cream. The items listed above may not be a complete list of foods and beverages to avoid. Contact your dietitian for more information. This information is not intended to replace advice given to you by your health care provider. Make sure you discuss any questions you have with your health care  provider. Document Released: 06/10/2008 Document Revised: 03/21/2016 Document Reviewed: 02/23/2014 Elsevier Interactive Patient Education  2017 [place].   Increase water intake, strive for at least 85 ounces/day.   Follow Heart Healthy diet Increase regular  exercise.  Recommend at least 30 minutes daily, 5 days per week of walking, jogging, biking, swimming, YouTube/Pinterest workout videos. Please schedule fasting lab appt at your convenience. Please schedule complete physical this winter/spring. WELCOME TO THE PRACTICE!

## 2017-08-10 NOTE — Assessment & Plan Note (Signed)
Increase water intake, strive for at least 85 ounces/day.   Follow Heart Healthy diet Increase regular exercise.  Recommend at least 30 minutes daily, 5 days per week of walking, jogging, biking, swimming, YouTube/Pinterest workout videos. Please schedule fasting lab appt at your convenience. Please schedule complete physical this winter/spring.

## 2017-08-10 NOTE — Progress Notes (Signed)
Subjective:    Patient ID: Jacqueline Chen, female    DOB: June 04, 1986, 31 y.o.   MRN: [id number]  HPI:  Jacqueline Chen is here to establish as a new pt.  She is a very pleasant 31 year old female.  PMH: Goiter that bx'Chen and US's annually for 3 years, last scan 12/17 and instructed that the goiter had shrunk in size and further imaging was not recommended. She drinks >100 oz of water/day and is slowly introducing regular exercise into daily routine.  She is home on maternity leave, she gave birth to a healthy daughter 8 weeks ago.  She is married and has another daughter aged 61.  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 She doesn't believe that her lipid panel has ever been checked and thinks that her TSH was drawn during her pregnancy. She is currently breast feeding and plans on continuing until "Minette Brine" turns [age].  Patient Care Team    Relationship Specialty Notifications Start End  Jacqueline Marble D, NP PCP - General Family Medicine  08/10/17   Louretta Shorten, MD Consulting Physician Obstetrics and Gynecology  08/10/17   Jacqueline Chen, [place] Physician Dermatology  08/10/17     Patient Active Problem List   Diagnosis Date Noted  . Healthcare maintenance 08/10/2017  . PROM (premature rupture of membranes) 06/16/2017  . Delivery normal 04/16/2014     Past Medical History:  Diagnosis Date  . Goiter 2015   multinodular goiter     History reviewed. No pertinent surgical history.   Family History  Problem Relation Age of Onset  . Healthy Mother   . Heart attack Father   . Hyperlipidemia Father   . Hypertension Father   . Healthy Sister   . Healthy Brother   . Healthy Daughter   . Cancer Paternal Grandmother        breast  . Healthy Daughter      Social History   Substance and Sexual Activity  Drug Use No     Social History   Substance and Sexual Activity  Alcohol Use No  . Frequency: Never   Comment: currently breast feeding but has 1 drink/wk  when not breastfeeding     Social History   Tobacco Use  Smoking Status Never Smoker  Smokeless Tobacco Never Used     Outpatient Encounter Medications as of 08/10/2017  Medication Sig  . cetirizine (ZYRTEC) 10 MG tablet Take 10 mg by mouth daily.  Marland Kitchen levothyroxine (SYNTHROID, LEVOTHROID) 25 MCG tablet Take 25 mcg by mouth daily before breakfast.  . norethindrone (MICRONOR,CAMILA,ERRIN) 0.35 MG tablet See admin instructions.  . Prenatal Vit-Fe Fumarate-FA (PRENATAL MULTIVITAMIN) TABS tablet Take 1 tablet by mouth daily at 12 noon.   No facility-administered encounter medications on file as of 08/10/2017.     Allergies: Patient has no known allergies.  Body mass index is 27.38 kg/m.  Blood pressure 102/69, pulse 78, height 5\' 7"  (1.702 m), weight 174 lb 12.8 oz (79.3 kg), currently breastfeeding.      Review of Systems  Constitutional: Positive for fatigue. Negative for activity change, appetite change, chills, diaphoresis, fever and unexpected weight change.  HENT: Negative for congestion.   Eyes: Negative for visual disturbance.  Respiratory: Negative for cough, chest tightness, shortness of breath, wheezing and stridor.   Cardiovascular: Negative for chest pain, palpitations and leg swelling.  Gastrointestinal: Negative for abdominal distention, abdominal pain, anal bleeding, blood in stool, constipation, diarrhea, nausea and vomiting.  Endocrine:  Negative for cold intolerance, heat intolerance, polydipsia, polyphagia and polyuria.  Genitourinary: Negative for difficulty urinating, flank pain and hematuria.  Musculoskeletal: Negative for arthralgias, back pain, gait problem, joint swelling, myalgias, neck pain and neck stiffness.  Skin: Negative for color change, pallor, rash and wound.  Neurological: Negative for dizziness and headaches.  Psychiatric/Behavioral: Positive for sleep disturbance. The patient is not nervous/anxious.        Insomnia r/t newborn in home        Objective:   Physical Exam  Constitutional: She is oriented to person, place, and time. She appears well-developed and well-nourished. No distress.  HENT:  Head: Normocephalic and atraumatic.  Right Ear: External ear normal.  Left Ear: External ear normal.  Cardiovascular: Normal rate, regular rhythm, normal heart sounds and intact distal pulses.  No murmur heard. Pulmonary/Chest: Effort normal and breath sounds normal. No respiratory distress. She has no wheezes. She has no rales. She exhibits no tenderness.  Neurological: She is alert and oriented to person, place, and time. Coordination normal.  Skin: Skin is warm and dry. No rash noted. She is not diaphoretic. No erythema. No pallor.  Psychiatric: She has a normal mood and affect. Her behavior is normal. Judgment and thought content normal.  Nursing note and vitals reviewed.         Assessment & Plan:   1. Healthcare maintenance     Healthcare maintenance Increase water intake, strive for at least 85 ounces/day.   Follow Heart Healthy diet Increase regular exercise.  Recommend at least 30 minutes daily, 5 days per week of walking, jogging, biking, swimming, YouTube/Pinterest workout videos. Please schedule fasting lab appt at your convenience. Please schedule complete physical this winter/spring.    FOLLOW-UP:  Return in about 3 months (around 11/10/2017) for CPE.

## 2017-08-14 DIAGNOSIS — D225 Melanocytic nevi of trunk: Secondary | ICD-10-CM | POA: Diagnosis not present

## 2017-08-14 DIAGNOSIS — D1801 Hemangioma of skin and subcutaneous tissue: Secondary | ICD-10-CM | POA: Diagnosis not present

## 2017-08-14 DIAGNOSIS — L821 Other seborrheic keratosis: Secondary | ICD-10-CM | POA: Diagnosis not present

## 2017-08-17 MED FILL — LEVOTHYROXINE 25 MCG TABLET: 25 | 60 days supply | Qty: 60 | Fill #2

## 2017-08-19 MED FILL — NORETHINDRONE 0.35 MG TAB: 0.35 | 84 days supply | Qty: 84 | Fill #1

## 2017-08-20 ENCOUNTER — Other Ambulatory Visit: Payer: 59

## 2017-08-20 DIAGNOSIS — Z0000 Encounter for general adult medical examination without abnormal findings: Secondary | ICD-10-CM

## 2017-08-21 LAB — CBC WITH DIFFERENTIAL/PLATELET
BASOS: 0 %
Basophils Absolute: 0 10*3/uL (ref 0.0–0.2)
EOS (ABSOLUTE): 0.1 10*3/uL (ref 0.0–0.4)
EOS: 1 %
HEMATOCRIT: 40.6 % (ref 34.0–46.6)
HEMOGLOBIN: 14 g/dL (ref 11.1–15.9)
IMMATURE GRANS (ABS): 0 10*3/uL (ref 0.0–0.1)
IMMATURE GRANULOCYTES: 0 %
Lymphocytes Absolute: 2.2 10*3/uL (ref 0.7–3.1)
Lymphs: 39 %
MCH: 29.5 pg (ref 26.6–33.0)
MCHC: 34.5 g/dL (ref 31.5–35.7)
MCV: 86 fL (ref 79–97)
MONOCYTES: 7 %
Monocytes Absolute: 0.4 10*3/uL (ref 0.1–0.9)
NEUTROS PCT: 53 %
Neutrophils Absolute: 3 10*3/uL (ref 1.4–7.0)
Platelets: 294 10*3/uL (ref 150–379)
RBC: 4.75 x10E6/uL (ref 3.77–5.28)
RDW: 14.9 % (ref 12.3–15.4)
WBC: 5.6 10*3/uL (ref 3.4–10.8)

## 2017-08-21 LAB — COMPREHENSIVE METABOLIC PANEL
ALBUMIN: 4.3 g/dL (ref 3.5–5.5)
ALK PHOS: 59 IU/L (ref 39–117)
ALT: 17 IU/L (ref 0–32)
AST: 23 IU/L (ref 0–40)
Albumin/Globulin Ratio: 1.6 (ref 1.2–2.2)
BUN/Creatinine Ratio: 22 (ref 9–23)
BUN: 17 mg/dL (ref 6–20)
Bilirubin Total: 0.6 mg/dL (ref 0.0–1.2)
CALCIUM: 9.4 mg/dL (ref 8.7–10.2)
CO2: 23 mmol/L (ref 20–29)
CREATININE: 0.79 mg/dL (ref 0.57–1.00)
Chloride: 104 mmol/L (ref 96–106)
GFR calc Af Amer: 115 mL/min/{1.73_m2} (ref >59)
GFR, EST NON AFRICAN AMERICAN: 100 mL/min/{1.73_m2} (ref >59)
GLUCOSE: 77 mg/dL (ref 65–99)
Globulin, Total: 2.7 g/dL (ref 1.5–4.5)
Potassium: 4.6 mmol/L (ref 3.5–5.2)
Sodium: 142 mmol/L (ref 134–144)
Total Protein: 7 g/dL (ref 6.0–8.5)

## 2017-08-21 LAB — LIPID PANEL
CHOL/HDL RATIO: 2.6 ratio (ref 0.0–4.4)
Cholesterol, Total: 158 mg/dL (ref 100–199)
HDL: 61 mg/dL (ref >39)
LDL Calculated: 89 mg/dL (ref 0–99)
TRIGLYCERIDES: 38 mg/dL (ref 0–149)
VLDL Cholesterol Cal: 8 mg/dL (ref 5–40)

## 2017-08-21 LAB — VITAMIN D 25 HYDROXY (VIT D DEFICIENCY, FRACTURES): VIT D 25 HYDROXY: 35.3 ng/mL (ref 30.0–100.0)

## 2017-08-21 LAB — TSH: TSH: 0.943 u[IU]/mL (ref 0.450–4.500)

## 2017-08-21 LAB — HEMOGLOBIN A1C
Est. average glucose Bld gHb Est-mCnc: 117 mg/dL
HEMOGLOBIN A1C: 5.7 % — AB (ref 4.8–5.6)

## 2017-08-24 ENCOUNTER — Encounter: Payer: Self-pay | Admitting: Adult Health

## 2017-08-25 ENCOUNTER — Other Ambulatory Visit: Payer: 59

## 2017-10-08 DIAGNOSIS — M9901 Segmental and somatic dysfunction of cervical region: Secondary | ICD-10-CM | POA: Diagnosis not present

## 2017-10-08 DIAGNOSIS — M9902 Segmental and somatic dysfunction of thoracic region: Secondary | ICD-10-CM | POA: Diagnosis not present

## 2017-10-08 DIAGNOSIS — R51 Headache: Secondary | ICD-10-CM | POA: Diagnosis not present

## 2017-10-19 ENCOUNTER — Other Ambulatory Visit: Payer: Self-pay

## 2017-10-19 MED ORDER — LEVOTHYROXINE SODIUM 25 MCG PO TABS: 25.0000 ug | ORAL_TABLET | Freq: Every day | ORAL | 2 refills | Status: DC

## 2017-10-19 MED FILL — LEVOTHYROXINE 25 MCG TABLET: 25 | 90 days supply | Qty: 90 | Fill #0

## 2017-10-19 NOTE — Telephone Encounter (Signed)
We have not prescribed these medications for the patient previously.  Please review and refill if appropriate.  T. Nelson, CMA  

## 2017-11-05 NOTE — Progress Notes (Deleted)
Subjective:    Patient ID: Jacqueline Chen, female    DOB: [birth year], 32 y.o.   MRN: [id number]  HPI:  08/10/17 OV: Jacqueline Chen is here to establish as a new pt.  She is a very pleasant 32 year old female.  PMH: Goiter that bx'd and US's annually for 3 years, last scan 12/17 and instructed that the goiter had shrunk in size and further imaging was not recommended. She drinks >100 oz of water/day and is slowly introducing regular exercise into daily routine.  She is home on maternity leave, she gave birth to a healthy daughter 8 weeks ago.  She is married and has another daughter aged 75.  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 She doesn't believe that her lipid panel has ever been checked and thinks that her TSH was drawn during her pregnancy. She is currently breast feeding and plans on continuing until "Minette Brine" turns [age].  11/09/17 OV: Jacqueline Chen presents for CPE.  Healthcare Maintenance: Immunizations-   Patient Care Team    Relationship Specialty Notifications Start End  Hendron, Valetta Fuller D, NP PCP - General Family Medicine  08/10/17   Louretta Shorten, MD Consulting Physician Obstetrics and Gynecology  08/10/17   Druscilla Brownie, MD Consulting Physician Dermatology  08/10/17     Patient Active Problem List   Diagnosis Date Noted  . Healthcare maintenance 08/10/2017  . PROM (premature rupture of membranes) 06/16/2017  . Delivery normal 04/16/2014     Past Medical History:  Diagnosis Date  . Goiter 2015   multinodular goiter     No past surgical history on file.   Family History  Problem Relation Age of Onset  . Healthy Mother   . Heart attack Father   . Hyperlipidemia Father   . Hypertension Father   . Healthy Sister   . Healthy Brother   . Healthy Daughter   . Cancer Paternal Grandmother        breast  . Healthy Daughter      Social History   Substance and Sexual Activity  Drug Use No     Social History   Substance and Sexual Activity  Alcohol  Use No  . Frequency: Never   Comment: currently breast feeding but has 1 drink/wk when not breastfeeding     Social History   Tobacco Use  Smoking Status Never Smoker  Smokeless Tobacco Never Used     Outpatient Encounter Medications as of 11/09/2017  Medication Sig  . cetirizine (ZYRTEC) 10 MG tablet Take 10 mg by mouth daily.  Marland Kitchen levothyroxine (SYNTHROID, LEVOTHROID) 25 MCG tablet Take 1 tablet (25 mcg total) by mouth daily before breakfast.  . norethindrone (MICRONOR,CAMILA,ERRIN) 0.35 MG tablet See admin instructions.  . Prenatal Vit-Fe Fumarate-FA (PRENATAL MULTIVITAMIN) TABS tablet Take 1 tablet by mouth daily at 12 noon.   No facility-administered encounter medications on file as of 11/09/2017.     Allergies: Patient has no known allergies.  There is no height or weight on file to calculate BMI.  currently breastfeeding.      Review of Systems  Constitutional: Positive for fatigue. Negative for activity change, appetite change, chills, diaphoresis, fever and unexpected weight change.  HENT: Negative for congestion.   Eyes: Negative for visual disturbance.  Respiratory: Negative for cough, chest tightness, shortness of breath, wheezing and stridor.   Cardiovascular: Negative for chest pain, palpitations and leg swelling.  Gastrointestinal: Negative for abdominal distention, abdominal pain, anal bleeding, blood in stool, constipation,  diarrhea, nausea and vomiting.  Endocrine: Negative for cold intolerance, heat intolerance, polydipsia, polyphagia and polyuria.  Genitourinary: Negative for difficulty urinating, flank pain and hematuria.  Musculoskeletal: Negative for arthralgias, back pain, gait problem, joint swelling, myalgias, neck pain and neck stiffness.  Skin: Negative for color change, pallor, rash and wound.  Neurological: Negative for dizziness and headaches.  Psychiatric/Behavioral: Positive for sleep disturbance. The patient is not nervous/anxious.         Insomnia r/t newborn in home       Objective:   Physical Exam  Constitutional: She is oriented to person, place, and time. She appears well-developed and well-nourished. No distress.  HENT:  Head: Normocephalic and atraumatic.  Right Ear: External ear normal.  Left Ear: External ear normal.  Cardiovascular: Normal rate, regular rhythm, normal heart sounds and intact distal pulses.  No murmur heard. Pulmonary/Chest: Effort normal and breath sounds normal. No respiratory distress. She has no wheezes. She has no rales. She exhibits no tenderness.  Neurological: She is alert and oriented to person, place, and time. Coordination normal.  Skin: Skin is warm and dry. No rash noted. She is not diaphoretic. No erythema. No pallor.  Psychiatric: She has a normal mood and affect. Her behavior is normal. Judgment and thought content normal.  Nursing note and vitals reviewed.         Assessment & Plan:   No diagnosis found.  No problem-specific Assessment & Plan notes found for this encounter.    FOLLOW-UP:  No Follow-up on file.

## 2017-11-09 ENCOUNTER — Encounter: Payer: 59 | Admitting: Adult Health

## 2017-11-12 MED FILL — NORETHINDRONE 0.35 MG TAB: 0.35 | 84 days supply | Qty: 84 | Fill #2

## 2017-11-18 ENCOUNTER — Encounter: Payer: Self-pay | Admitting: Adult Health

## 2017-11-18 ENCOUNTER — Ambulatory Visit (INDEPENDENT_AMBULATORY_CARE_PROVIDER_SITE_OTHER): Payer: 59 | Admitting: Adult Health

## 2017-11-18 VITALS — BP 105/67 | HR 82 | Ht 67.0 in | Wt 167.3 lb

## 2017-11-18 DIAGNOSIS — R7303 Prediabetes: Secondary | ICD-10-CM | POA: Diagnosis not present

## 2017-11-18 DIAGNOSIS — Z0000 Encounter for general adult medical examination without abnormal findings: Secondary | ICD-10-CM | POA: Diagnosis not present

## 2017-11-18 LAB — POCT GLYCOSYLATED HEMOGLOBIN (HGB A1C): Hemoglobin A1C: 5

## 2017-11-18 NOTE — Progress Notes (Signed)
Subjective:    Patient ID: Jacqueline Chen, female    DOB: 05/23/1986, 32 y.o.   MRN: [id number]  HPI: 08/10/17 OV:  Jacqueline Chen is here to establish as a new pt.  She is a very pleasant 32 year old female.  PMH: Goiter that bx'd and US's annually for 3 years, last scan 12/17 and instructed that the goiter had shrunk in size and further imaging was not recommended. She drinks >100 oz of water/day and is slowly introducing regular exercise into daily routine.  She is home on maternity leave, she gave birth to a healthy daughter 8 weeks ago.  She is married and has another daughter aged 70.  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 She doesn't believe that her lipid panel has ever been checked and thinks that her TSH was drawn during her pregnancy. She is currently breast feeding and plans on continuing until "Minette Brine" turns [age].  11/18/17 OV: Jacqueline Chen is for here for CPE. She has started yoga practice 3-5 times/week, she has lost > 7lbs since last OV in Nov 2018. She continues to breast feed (baby is 17months old) and drinks >100 oz water/day. Her only concern is her A1c was 5.7 when checked in Dec 2018.  She delivered her second child in Oct 2018 and still had "baby weight". She denies family hx of diabetes and is quite concerned that her number was elevated. A1c repeated today- 5.0- PERFECT!  Patient Care Team    Relationship Specialty Notifications Start End  Mina Marble D, NP PCP - General Family Medicine  08/10/17   Louretta Shorten, MD Consulting Physician Obstetrics and Gynecology  08/10/17   Druscilla Brownie, [place] Physician Dermatology  08/10/17     Patient Active Problem List   Diagnosis Date Noted  . Healthcare maintenance 08/10/2017  . PROM (premature rupture of membranes) 06/16/2017  . Delivery normal 04/16/2014     Past Medical History:  Diagnosis Date  . Goiter 2015   multinodular goiter     History reviewed. No pertinent surgical  history.   Family History  Problem Relation Age of Onset  . Healthy Mother   . Heart attack Father   . Hyperlipidemia Father   . Hypertension Father   . Healthy Sister   . Healthy Brother   . Healthy Daughter   . Cancer Paternal Grandmother        breast  . Healthy Daughter      Social History   Substance and Sexual Activity  Drug Use No     Social History   Substance and Sexual Activity  Alcohol Use No  . Frequency: Never   Comment: currently breast feeding but has 1 drink/wk when not breastfeeding     Social History   Tobacco Use  Smoking Status Never Smoker  Smokeless Tobacco Never Used     Outpatient Encounter Medications as of 11/18/2017  Medication Sig  . cetirizine (ZYRTEC) 10 MG tablet Take 10 mg by mouth daily.  Marland Kitchen levothyroxine (SYNTHROID, LEVOTHROID) 25 MCG tablet Take 1 tablet (25 mcg total) by mouth daily before breakfast.  . norethindrone (MICRONOR,CAMILA,ERRIN) 0.35 MG tablet See admin instructions.  . Prenatal Vit-Fe Fumarate-FA (PRENATAL MULTIVITAMIN) TABS tablet Take 1 tablet by mouth daily at 12 noon.   No facility-administered encounter medications on file as of 11/18/2017.     Allergies: Patient has no known allergies.  Body mass index is 26.2 kg/m.  Blood pressure 105/67, pulse 82, height 5'  7" (1.702 m), weight 167 lb 4.8 oz (75.9 kg), SpO2 99 %, currently breastfeeding.      Review of Systems  Constitutional: Positive for fatigue. Negative for activity change, appetite change, chills, diaphoresis, fever and unexpected weight change.  HENT: Negative for congestion.   Eyes: Negative for visual disturbance.  Respiratory: Negative for cough, chest tightness, shortness of breath, wheezing and stridor.   Cardiovascular: Negative for chest pain, palpitations and leg swelling.  Gastrointestinal: Negative for abdominal distention, abdominal pain, anal bleeding, blood in stool, constipation, diarrhea, nausea and vomiting.  Endocrine:  Negative for cold intolerance, heat intolerance, polydipsia, polyphagia and polyuria.  Genitourinary: Negative for difficulty urinating, flank pain and hematuria.  Musculoskeletal: Negative for arthralgias, back pain, gait problem, joint swelling, myalgias, neck pain and neck stiffness.  Skin: Negative for color change, pallor, rash and wound.  Neurological: Negative for dizziness and headaches.  Psychiatric/Behavioral: Negative for confusion, decreased concentration, dysphoric mood, hallucinations, self-injury, sleep disturbance and suicidal ideas. The patient is not nervous/anxious and is not hyperactive.        Objective:   Physical Exam  Constitutional: She is oriented to person, place, and time. She appears well-developed and well-nourished. No distress.  HENT:  Head: Normocephalic and atraumatic.  Right Ear: Hearing, tympanic membrane, external ear and ear canal normal. Tympanic membrane is not erythematous and not bulging. No decreased hearing is noted.  Left Ear: Hearing, tympanic membrane, external ear and ear canal normal. Tympanic membrane is not erythematous and not bulging. No decreased hearing is noted.  Nose: Nose normal. No mucosal edema or rhinorrhea. Right sinus exhibits no maxillary sinus tenderness and no frontal sinus tenderness. Left sinus exhibits no maxillary sinus tenderness and no frontal sinus tenderness.  Mouth/Throat: Uvula is midline and oropharynx is clear and moist. No oropharyngeal exudate, posterior oropharyngeal edema or posterior oropharyngeal erythema.  Excessively enlarged L tonsil since childhood  Not causing sx's  Eyes: Conjunctivae are normal. Pupils are equal, round, and reactive to light.  Cardiovascular: Normal rate, regular rhythm, normal heart sounds and intact distal pulses.  No murmur heard. Pulmonary/Chest: Effort normal and breath sounds normal. No respiratory distress. She has no wheezes. She has no rales. She exhibits no tenderness.   Abdominal: Soft. Bowel sounds are normal.  Neurological: She is alert and oriented to person, place, and time. Coordination normal.  Skin: Skin is warm and dry. No rash noted. She is not diaphoretic. No erythema. No pallor.  Psychiatric: She has a normal mood and affect. Her behavior is normal. Judgment and thought content normal.  Nursing note and vitals reviewed.         Assessment & Plan:   1. Prediabetes   2. Healthcare maintenance     Healthcare maintenance YOU ARE DOING A GREAT JOB!!! Keep up the yoga practice, excellent water intake and healthy eating. Lost > 7 lbs since last OV in Nov 2018 Recommend annual follow-up with fasting labs.  Prediabetes Lab Results  Component Value Date   HGBA1C 5.0 11/18/2017   HGBA1C 5.7 (H) 08/20/2017   Jump in A1c r/t post pregnancy wt    FOLLOW-UP:  Return in about 1 year (around 11/19/2018) for CPE, Fasting Labs. No diagnosis found.  No problem-specific Assessment & Plan notes found for this encounter.    FOLLOW-UP:  No Follow-up on file.

## 2017-11-18 NOTE — Patient Instructions (Addendum)

## 2017-11-18 NOTE — Assessment & Plan Note (Signed)
Lab Results  Component Value Date   HGBA1C 5.0 11/18/2017   HGBA1C 5.7 (H) 08/20/2017   Jump in A1c r/t post pregnancy wt

## 2017-11-18 NOTE — Assessment & Plan Note (Signed)
YOU ARE DOING A GREAT JOB!!! Keep up the yoga practice, excellent water intake and healthy eating. Lost > 7 lbs since last OV in Nov 2018 Recommend annual follow-up with fasting labs.

## 2017-11-19 DIAGNOSIS — M9901 Segmental and somatic dysfunction of cervical region: Secondary | ICD-10-CM | POA: Diagnosis not present

## 2017-11-19 DIAGNOSIS — R51 Headache: Secondary | ICD-10-CM | POA: Diagnosis not present

## 2017-11-19 DIAGNOSIS — M9902 Segmental and somatic dysfunction of thoracic region: Secondary | ICD-10-CM | POA: Diagnosis not present

## 2017-12-01 ENCOUNTER — Other Ambulatory Visit: Payer: Self-pay | Admitting: Adult Health

## 2017-12-01 ENCOUNTER — Encounter: Payer: Self-pay | Admitting: Adult Health

## 2017-12-01 DIAGNOSIS — R2241 Localized swelling, mass and lump, right lower limb: Secondary | ICD-10-CM

## 2017-12-01 DIAGNOSIS — M7989 Other specified soft tissue disorders: Secondary | ICD-10-CM

## 2017-12-02 ENCOUNTER — Telehealth: Payer: Self-pay | Admitting: Adult Health

## 2017-12-02 NOTE — Telephone Encounter (Signed)
Patient called states she wants to switch her [place] to [facility] for :  order for US Venous Img lower ext- R  --I do not see her having an appt yet, please review & schedule patient w/ [facility].  -glh

## 2017-12-10 ENCOUNTER — Ambulatory Visit
Admission: RE | Admit: 2017-12-10 | Discharge: 2017-12-10 | Disposition: A | Discharge status: Home / Self Care | Payer: 59 | Source: Ambulatory Visit | Attending: Adult Health | Admitting: Adult Health

## 2017-12-10 DIAGNOSIS — R2241 Localized swelling, mass and lump, right lower limb: Secondary | ICD-10-CM

## 2017-12-10 DIAGNOSIS — M7989 Other specified soft tissue disorders: Secondary | ICD-10-CM | POA: Diagnosis not present

## 2017-12-24 DIAGNOSIS — M9901 Segmental and somatic dysfunction of cervical region: Secondary | ICD-10-CM | POA: Diagnosis not present

## 2017-12-24 DIAGNOSIS — M9902 Segmental and somatic dysfunction of thoracic region: Secondary | ICD-10-CM | POA: Diagnosis not present

## 2017-12-24 DIAGNOSIS — R51 Headache: Secondary | ICD-10-CM | POA: Diagnosis not present

## 2018-01-07 ENCOUNTER — Encounter: Payer: Self-pay | Admitting: Adult Health

## 2018-01-13 NOTE — Progress Notes (Addendum)
Subjective:    Patient ID: Jacqueline Chen, female    DOB: 1986-03-23, 32 y.o.   MRN: [id number]  HPI:  Jacqueline Chen is here RLE pain and swelling. 2014 she sustained injury to RLE, medial shin r/t playing kick ball  Pain eventually resolved, however swelling over soleus muscle has never resolved. She reports pain has waxed/waned over the years with recent increase since March 2019, after she had message during a pedicure.  12/10/17 US Venous Img RLE IMPRESSION: No evidence of Chen venous thrombosis.  Today she reports intermittent pain over R soleus muscle, rated 3/10 and described as throbbing. She also reports intermittent numbness/tingling of R great toe She denies R knee or R ankle pain She denies instability of difficulty with ambulation. She has sporadically been using OTC NSAIDS She denies R popliteal pain  Patient Care Team    Relationship Specialty Notifications Start End  Mina Marble D, NP PCP - General Family Medicine  08/10/17   Louretta Shorten, MD Consulting Physician Obstetrics and Gynecology  08/10/17   Druscilla Brownie, [place] Physician Dermatology  08/10/17     Patient Active Problem List   Diagnosis Date Noted  . Mass of leg, right 01/15/2018  . Pain in right leg 01/15/2018  . Prediabetes 11/18/2017  . Healthcare maintenance 08/10/2017  . PROM (premature rupture of membranes) 06/16/2017  . Delivery normal 04/16/2014     Past Medical History:  Diagnosis Date  . Goiter 2015   multinodular goiter     History reviewed. No pertinent surgical history.   Family History  Problem Relation Age of Onset  . Healthy Mother   . Heart attack Father   . Hyperlipidemia Father   . Hypertension Father   . Healthy Sister   . Healthy Brother   . Healthy Daughter   . Cancer Paternal Grandmother        breast  . Healthy Daughter      Social History   Substance and Sexual Activity  Drug Use No     Social History   Substance and Sexual Activity    Alcohol Use No  . Frequency: Never   Comment: currently breast feeding but has 1 drink/wk when not breastfeeding     Social History   Tobacco Use  Smoking Status Never Smoker  Smokeless Tobacco Never Used     Outpatient Encounter Medications as of 01/15/2018  Medication Sig  . cetirizine (ZYRTEC) 10 MG tablet Take 10 mg by mouth daily.  Marland Kitchen levothyroxine (SYNTHROID, LEVOTHROID) 25 MCG tablet Take 1 tablet (25 mcg total) by mouth daily before breakfast.  . norethindrone (MICRONOR,CAMILA,ERRIN) 0.35 MG tablet See admin instructions.  . Prenatal Vit-Fe Fumarate-FA (PRENATAL MULTIVITAMIN) TABS tablet Take 1 tablet by mouth daily at 12 noon.   No facility-administered encounter medications on file as of 01/15/2018.     Allergies: Patient has no known allergies.  Body mass index is 26.05 kg/m.  Blood pressure 96/65, pulse 93, height 5\' 7"  (1.702 m), weight 166 lb 4.8 oz (75.4 kg), SpO2 93 %, currently breastfeeding.    Review of Systems  Constitutional: Positive for fatigue. Negative for activity change, appetite change, chills, diaphoresis, fever and unexpected weight change.  Eyes: Negative for visual disturbance.  Respiratory: Negative for cough, chest tightness, shortness of breath, wheezing and stridor.   Cardiovascular: Negative for chest pain, palpitations and leg swelling.  Musculoskeletal: Positive for arthralgias and myalgias. Negative for back pain, gait problem, joint swelling, neck pain and neck stiffness.  Skin: Negative for color change, pallor, rash and wound.  Neurological: Negative for dizziness and headaches.  Hematological: Does not bruise/bleed easily.      Objective:   Physical Exam  Constitutional: She appears well-developed and well-nourished. No distress.  HENT:  Head: Normocephalic and atraumatic.  Right Ear: External ear normal.  Left Ear: External ear normal.  Eyes: Pupils are equal, round, and reactive to light. Conjunctivae and EOM are normal.   Musculoskeletal: She exhibits edema and tenderness.       Right knee: Normal.       Right ankle: Normal.       Right lower leg: She exhibits tenderness and swelling. She exhibits no bony tenderness.       Legs: About a fist size palpable mass noted over R soleus muscle No warmth/redness noted. Strong pedal pulses, brisk cap refill Homans Sign: neg  Skin: Skin is warm and dry. Capillary refill takes less than 2 seconds. No rash noted. She is not diaphoretic. No erythema. No pallor.  Psychiatric: She has a normal mood and affect. Her behavior is normal. Judgment and thought content normal.  Nursing note and vitals reviewed.         Assessment & Plan:   1. Mass of leg, right   2. Pain in right leg     Pain in right leg Xray- Recommend rest, Ice, compression socks, and OTC Ibuprofen 800mg  every 8 hrs with food. Referral to Orthopedics placed. Please call clinic with any questions/concerns.    FOLLOW-UP:  Return if symptoms worsen or fail to improve.

## 2018-01-15 ENCOUNTER — Ambulatory Visit: Payer: 59

## 2018-01-15 ENCOUNTER — Ambulatory Visit (INDEPENDENT_AMBULATORY_CARE_PROVIDER_SITE_OTHER): Payer: 59 | Admitting: Adult Health

## 2018-01-15 ENCOUNTER — Encounter: Payer: Self-pay | Admitting: Adult Health

## 2018-01-15 VITALS — BP 96/65 | HR 93 | Ht 67.0 in | Wt 166.3 lb

## 2018-01-15 DIAGNOSIS — M79604 Pain in right leg: Secondary | ICD-10-CM

## 2018-01-15 DIAGNOSIS — R2241 Localized swelling, mass and lump, right lower limb: Secondary | ICD-10-CM | POA: Diagnosis not present

## 2018-01-15 DIAGNOSIS — M7989 Other specified soft tissue disorders: Secondary | ICD-10-CM | POA: Diagnosis not present

## 2018-01-15 DIAGNOSIS — M79661 Pain in right lower leg: Secondary | ICD-10-CM | POA: Diagnosis not present

## 2018-01-15 NOTE — Patient Instructions (Signed)

## 2018-01-15 NOTE — Assessment & Plan Note (Signed)
Xray- Recommend rest, Ice, compression socks, and OTC Ibuprofen 800mg  every 8 hrs with food. Referral to Orthopedics placed. Please call clinic with any questions/concerns.

## 2018-01-19 ENCOUNTER — Ambulatory Visit (INDEPENDENT_AMBULATORY_CARE_PROVIDER_SITE_OTHER): Payer: 59 | Admitting: Orthopaedic Surgery

## 2018-01-19 ENCOUNTER — Encounter (INDEPENDENT_AMBULATORY_CARE_PROVIDER_SITE_OTHER): Payer: Self-pay | Admitting: Orthopaedic Surgery

## 2018-01-19 DIAGNOSIS — M79604 Pain in right leg: Secondary | ICD-10-CM | POA: Diagnosis not present

## 2018-01-19 NOTE — Progress Notes (Signed)
Office Visit Note   Patient: Jacqueline Chen           Date of Birth: 24-Oct-1985           MRN: [id number] Visit Date: 01/19/2018              Requested by: Esaw Grandchild, NP [place], [place] [id number] PCP: Esaw Grandchild, NP   Assessment & Plan: Visit Diagnoses:  1. Pain in right leg     Plan: Impression is right calf mass lipoma versus intramuscular lesion such as myositis ossificans.  We will obtain MRI with and without contrast to fully evaluate the mass and to characterize this.  Follow-up in about a week and a half  Follow-Up Instructions: Return in about 10 days (around 01/29/2018).   Orders:  Orders Placed This Encounter  Procedures  . MR TIBIA FIBULA RIGHT W WO CONTRAST   No orders of the defined types were placed in this encounter.     Procedures: No procedures performed   Clinical Data: No additional findings.   Subjective: Chief Complaint  Patient presents with  . 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  She denies any recent enlargement.  Denies any constitutional symptoms.  She states that she may have had a pulled muscle back in college but she does not remember any specific injuries.  Denies any family history or personal history of cancer.  Denies any skin changes.  She does have some referred numbness and tingling in the great toe.   Review of Systems  Constitutional: Negative.   HENT: Negative.   Eyes: Negative.   Respiratory: Negative.   Cardiovascular: Negative.   Endocrine: Negative.   Musculoskeletal: Negative.   Neurological: Negative.   Hematological: Negative.   Psychiatric/Behavioral: Negative.   All other systems reviewed and are negative.    Objective: Vital Signs: There were no vitals taken for this visit.  Physical Exam  Constitutional: She is oriented to person, place, and time. She  appears well-developed and well-nourished.  HENT:  Head: Normocephalic and atraumatic.  Eyes: EOM are normal.  Neck: Neck supple.  Pulmonary/Chest: Effort normal.  Abdominal: Soft.  Neurological: She is alert and oriented to person, place, and time.  Skin: Skin is warm. Capillary refill takes less than 2 seconds.  Psychiatric: She has a normal mood and affect. Her behavior is normal. Judgment and thought content normal.  Nursing note and vitals reviewed.   Ortho Exam Right calf exam shows slight enlargement on the anteromedial aspect with not well-circumscribed prescribed borders.  There is no overlying skin changes.  No overly firm.  Neurovascular intact distally. Specialty Comments:  No specialty comments available.  Imaging: No results found.   PMFS History: Patient Active Problem List   Diagnosis Date Noted  . Mass of leg, right 01/15/2018  . Pain in right leg 01/15/2018  . Prediabetes 11/18/2017  . Healthcare maintenance 08/10/2017  . PROM (premature rupture of membranes) 06/16/2017  . Delivery normal 04/16/2014   Past Medical History:  Diagnosis Date  . Goiter 2015   multinodular goiter    Family History  Problem Relation Age of Onset  . Healthy Mother   . Heart attack Father   . Hyperlipidemia Father   . Hypertension Father   . Healthy Sister   . Healthy Brother   .  Healthy Daughter   . Cancer Paternal Grandmother        breast  . Healthy Daughter     No past surgical history on file. Social History   Occupational History  . Not on file  Tobacco Use  . Smoking status: Never Smoker  . Smokeless tobacco: Never Used  Substance and Sexual Activity  . Alcohol use: No    Frequency: Never    Comment: currently breast feeding but has 1 drink/wk when not breastfeeding  . Drug use: No  . Sexual activity: Yes    Birth control/protection: Pill

## 2018-01-20 MED FILL — LEVOTHYROXINE 25 MCG TABLET: 25 | 90 days supply | Qty: 90 | Fill #1

## 2018-01-21 DIAGNOSIS — R51 Headache: Secondary | ICD-10-CM | POA: Diagnosis not present

## 2018-01-21 DIAGNOSIS — M9902 Segmental and somatic dysfunction of thoracic region: Secondary | ICD-10-CM | POA: Diagnosis not present

## 2018-01-21 DIAGNOSIS — M9901 Segmental and somatic dysfunction of cervical region: Secondary | ICD-10-CM | POA: Diagnosis not present

## 2018-01-28 ENCOUNTER — Ambulatory Visit
Admission: RE | Admit: 2018-01-28 | Discharge: 2018-01-28 | Disposition: A | Discharge status: Home / Self Care | Payer: 59 | Source: Ambulatory Visit | Attending: Orthopaedic Surgery | Admitting: Orthopaedic Surgery

## 2018-01-28 ENCOUNTER — Other Ambulatory Visit (INDEPENDENT_AMBULATORY_CARE_PROVIDER_SITE_OTHER): Payer: Self-pay | Admitting: Orthopaedic Surgery

## 2018-01-28 DIAGNOSIS — M79604 Pain in right leg: Secondary | ICD-10-CM

## 2018-01-28 DIAGNOSIS — M799 Soft tissue disorder, unspecified: Secondary | ICD-10-CM | POA: Diagnosis not present

## 2018-01-29 ENCOUNTER — Ambulatory Visit (INDEPENDENT_AMBULATORY_CARE_PROVIDER_SITE_OTHER): Payer: 59 | Admitting: Orthopaedic Surgery

## 2018-01-29 ENCOUNTER — Encounter (INDEPENDENT_AMBULATORY_CARE_PROVIDER_SITE_OTHER): Payer: Self-pay | Admitting: Orthopaedic Surgery

## 2018-01-29 DIAGNOSIS — R2241 Localized swelling, mass and lump, right lower limb: Secondary | ICD-10-CM

## 2018-01-29 NOTE — Progress Notes (Signed)
Office Visit Note   Patient: Jacqueline Chen           Date of Birth: 1986-02-19           MRN: [id number] Visit Date: 01/29/2018              Requested by: Esaw Grandchild, NP [place], [place] [address] PCP: Esaw Grandchild, NP   Assessment & Plan: Visit Diagnoses:  1. Mass of leg, right     Plan: MRI findings consistent with just some prominence of the subcutaneous fat.  There is no mass or lesions or anything worrisome.  These findings were discussed with the patient.  Reassurance was given.  Patient instructed to return if she sees any worrisome features that we discussed today.  Follow-up as needed.  Follow-Up Instructions: Return if symptoms worsen or fail to improve.   Orders:  No orders of the defined types were placed in this encounter.  No orders of the defined types were placed in this encounter.     Procedures: No procedures performed   Clinical Data: No additional findings.   Subjective: Chief Complaint  Patient presents with  . Right Leg - Pain, Follow-up    Munira follows up today for MRI reviewed.   Review of Systems   Objective: Vital Signs: There were no vitals taken for this visit.  Physical Exam  Ortho Exam Exam is unchanged. Specialty Comments:  No specialty comments available.  Imaging: Mr Tibia Fibula Right Wo Contrast  Result Date: 01/28/2018 CLINICAL DATA:  Soft tissue mass in the right lower leg. Patient declined intravenous contrast because she is currently breast-feeding. EXAM: MRI OF LOWER RIGHT EXTREMITY WITHOUT CONTRAST TECHNIQUE: Multiplanar, multisequence MR imaging of the right lower leg was performed. No intravenous contrast was administered. COMPARISON:  None. FINDINGS: Bones/Joint/Cartilage No focal marrow signal abnormality. No fracture or dislocation. No aggressive osseous lesion. No periosteal reaction or bone destruction. Ligaments Visualized medial collateral ligament is intact bilaterally. Right ACL  and PCL are intact. Muscles and Tendons Muscles are normal.  No muscle edema or muscle atrophy. Soft tissues No soft tissue mass at the site of clinical palpable abnormality. Prominence of subcutaneous fat which is similar to the contralateral side. No focal fluid collection or hematoma. IMPRESSION: 1. No soft tissue mass at the site of clinical palpable abnormality in the medial aspect of the right lower leg. Prominence of subcutaneous fat which is similar to the contralateral side. Electronically Signed   By: Kathreen Devoid   On: 01/28/2018 11:51     PMFS History: Patient Active Problem List   Diagnosis Date Noted  . Mass of leg, right 01/15/2018  . Pain in right leg 01/15/2018  . Prediabetes 11/18/2017  . Healthcare maintenance 08/10/2017  . PROM (premature rupture of membranes) 06/16/2017  . Delivery normal 04/16/2014   Past Medical History:  Diagnosis Date  . Goiter 2015   multinodular goiter    Family History  Problem Relation Age of Onset  . Healthy Mother   . Heart attack Father   . Hyperlipidemia Father   . Hypertension Father   . Healthy Sister   . Healthy Brother   . Healthy Daughter   . Cancer Paternal Grandmother        breast  . Healthy Daughter     History reviewed. No pertinent surgical history. Social History   Occupational History  . Not on file  Tobacco Use  . Smoking status: Never Smoker  .  Smokeless tobacco: Never Used  Substance and Sexual Activity  . Alcohol use: No    Frequency: Never    Comment: currently breast feeding but has 1 drink/wk when not breastfeeding  . Drug use: No  . Sexual activity: Yes    Birth control/protection: Pill

## 2018-02-04 DIAGNOSIS — H52222 Regular astigmatism, left eye: Secondary | ICD-10-CM | POA: Diagnosis not present

## 2018-02-04 DIAGNOSIS — H5212 Myopia, left eye: Secondary | ICD-10-CM | POA: Diagnosis not present

## 2018-02-04 MED FILL — NORETHINDRONE 0.35 MG TAB: 0.35 | 84 days supply | Qty: 84 | Fill #3

## 2018-02-18 DIAGNOSIS — M9901 Segmental and somatic dysfunction of cervical region: Secondary | ICD-10-CM | POA: Diagnosis not present

## 2018-02-18 DIAGNOSIS — R51 Headache: Secondary | ICD-10-CM | POA: Diagnosis not present

## 2018-02-18 DIAGNOSIS — M9902 Segmental and somatic dysfunction of thoracic region: Secondary | ICD-10-CM | POA: Diagnosis not present

## 2018-03-25 DIAGNOSIS — R51 Headache: Secondary | ICD-10-CM | POA: Diagnosis not present

## 2018-03-25 DIAGNOSIS — M9902 Segmental and somatic dysfunction of thoracic region: Secondary | ICD-10-CM | POA: Diagnosis not present

## 2018-03-25 DIAGNOSIS — M9901 Segmental and somatic dysfunction of cervical region: Secondary | ICD-10-CM | POA: Diagnosis not present

## 2018-04-22 MED FILL — LEVOTHYROXINE 25 MCG TABLET: 25 | 90 days supply | Qty: 90 | Fill #2

## 2018-05-03 MED FILL — NORETHINDRONE 0.35 MG TAB: 0.35 | 84 days supply | Qty: 84 | Fill #4

## 2018-05-11 DIAGNOSIS — M9901 Segmental and somatic dysfunction of cervical region: Secondary | ICD-10-CM | POA: Diagnosis not present

## 2018-05-11 DIAGNOSIS — R51 Headache: Secondary | ICD-10-CM | POA: Diagnosis not present

## 2018-05-11 DIAGNOSIS — M9902 Segmental and somatic dysfunction of thoracic region: Secondary | ICD-10-CM | POA: Diagnosis not present

## 2018-05-20 IMAGING — US US EXTREM LOW VENOUS*R*
1 series · 13 of 24 positions shown · non-contrast
Comparison: 02/17/2013

CLINICAL DATA: Right lower extremity swelling



[Series 1: us extrem low venous*right* · 0.07mm/px · 13 of 28 slices shown]
[im 1/28]
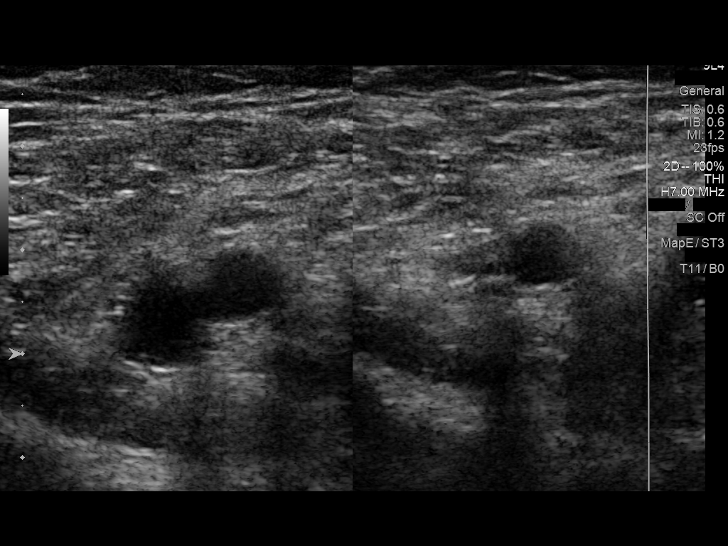
[im 3/28]
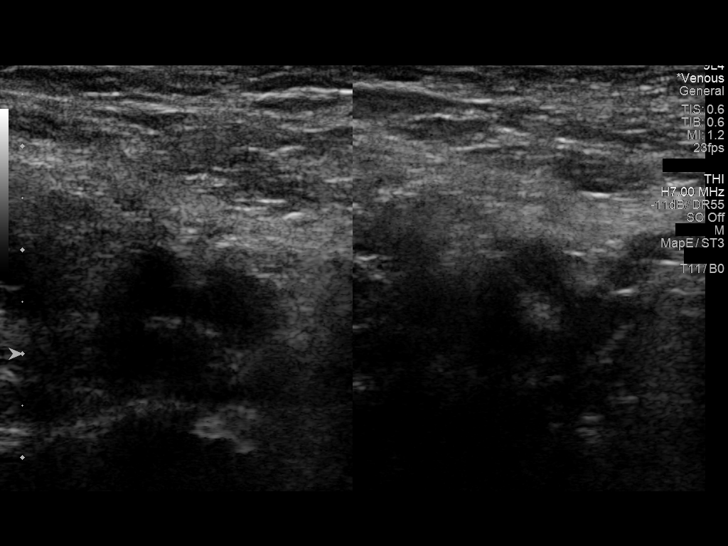
[im 5/28]
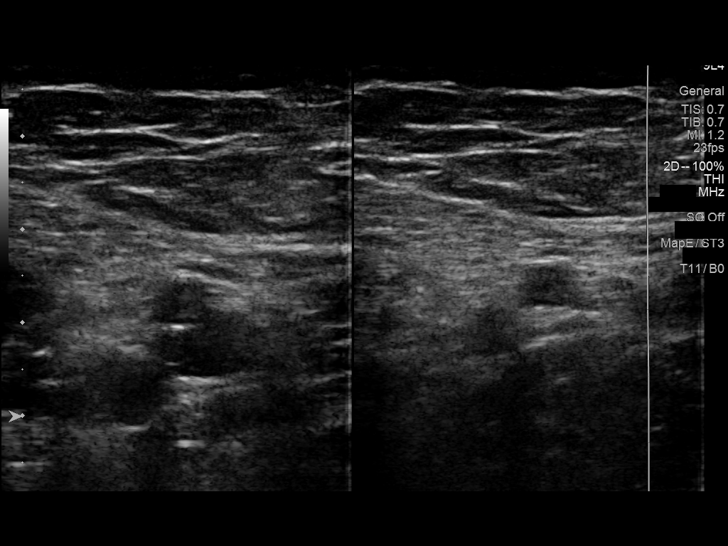
[im 8/28]
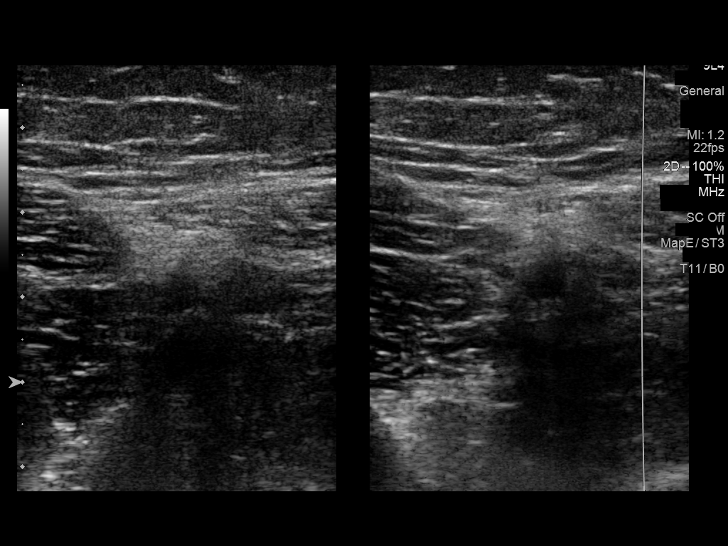
[im 10/28]
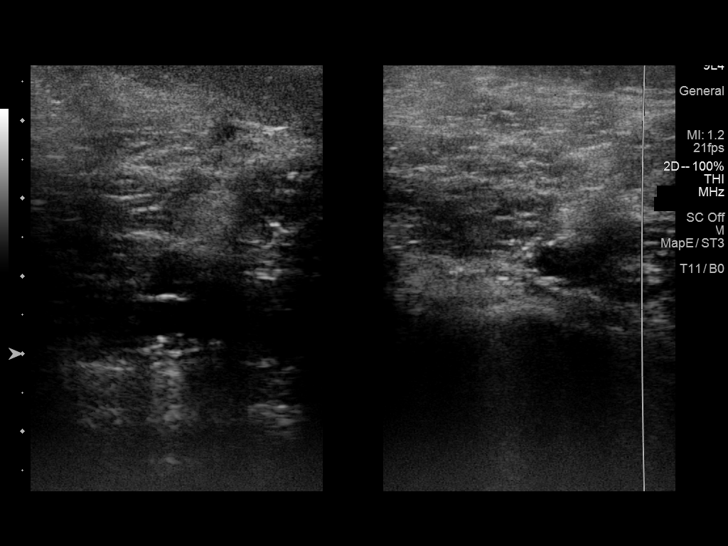
[im 12/28]
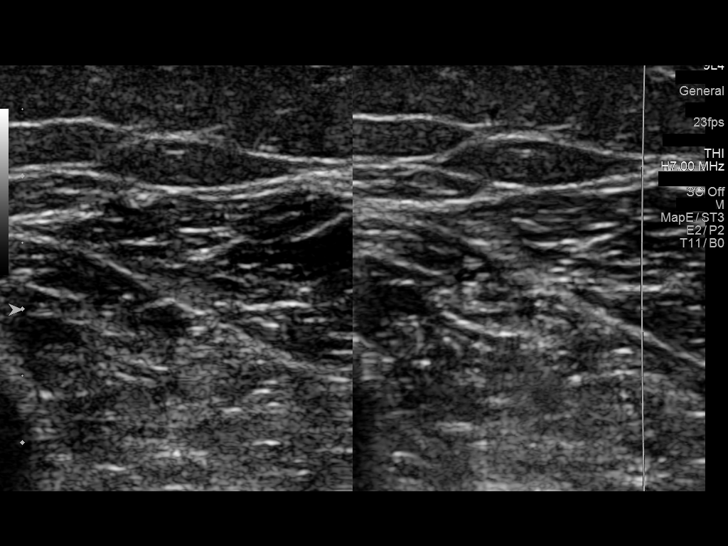
[im 15/28]
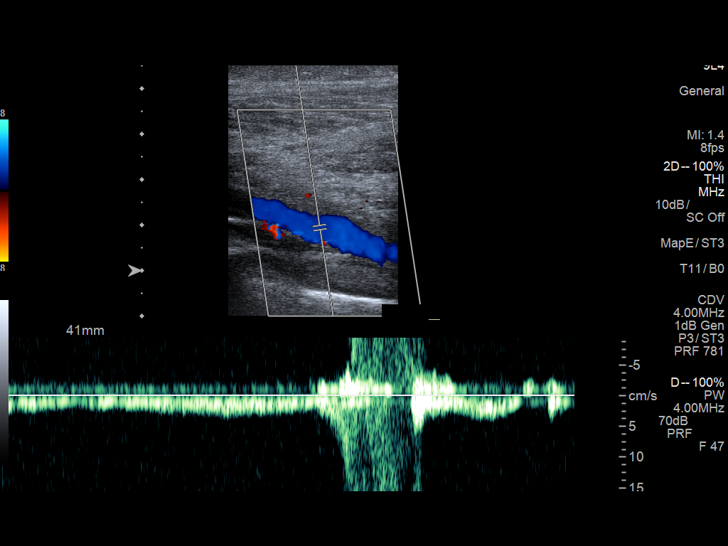
[im 16/28]
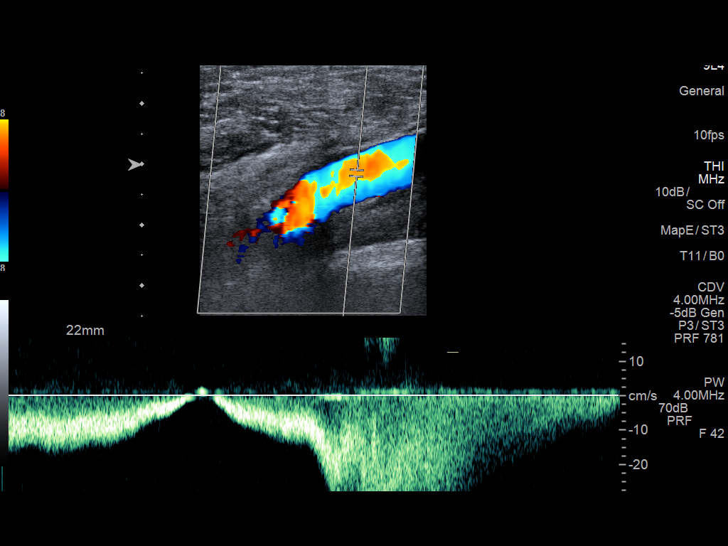
[im 18/28]
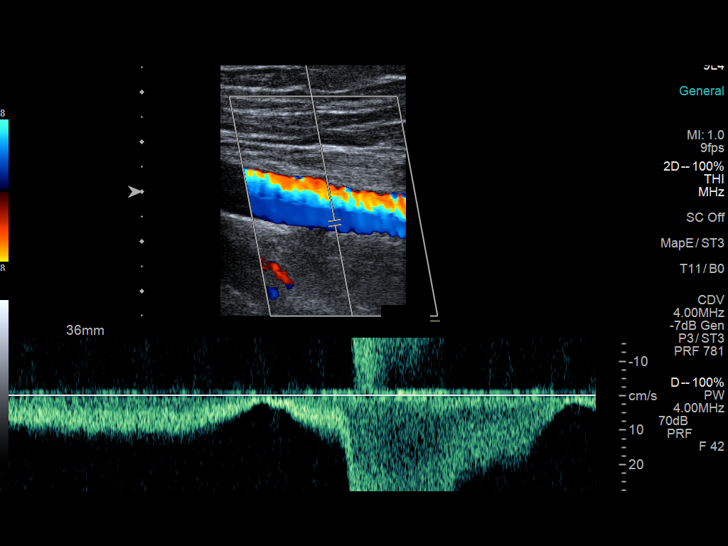
[im 20/28]
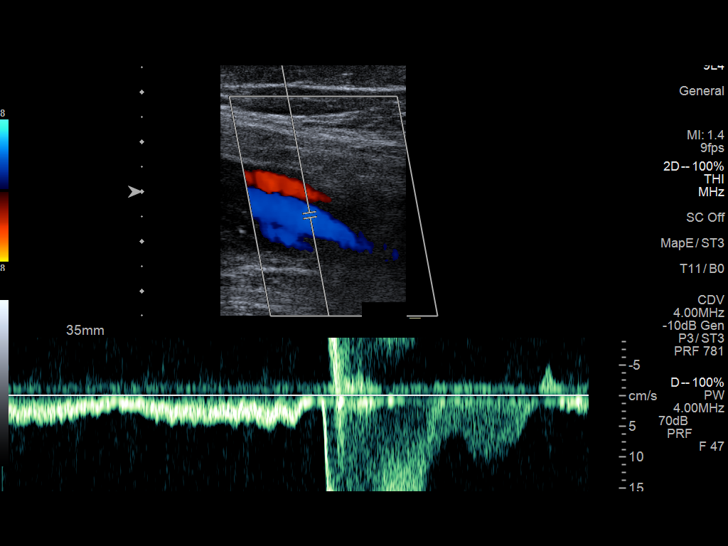
[im 23/28]
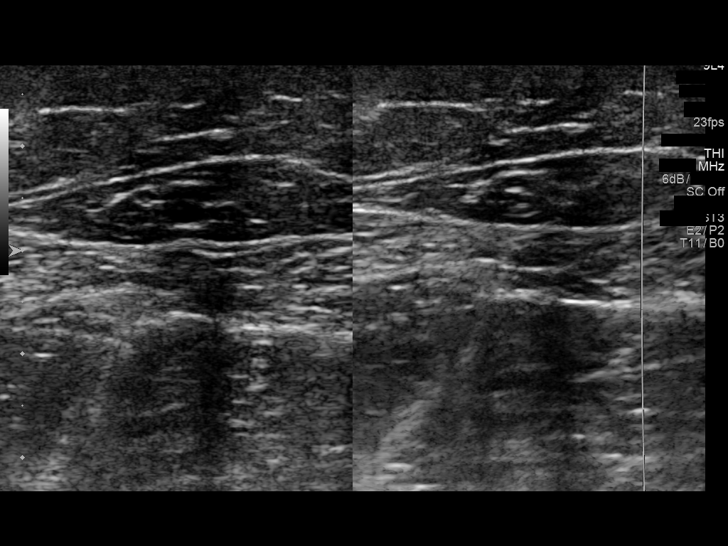
[im 25/28]
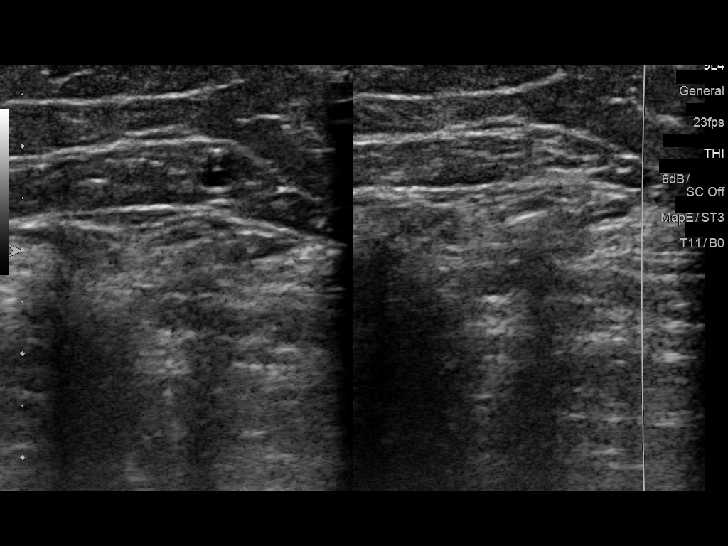
[im 28/28]
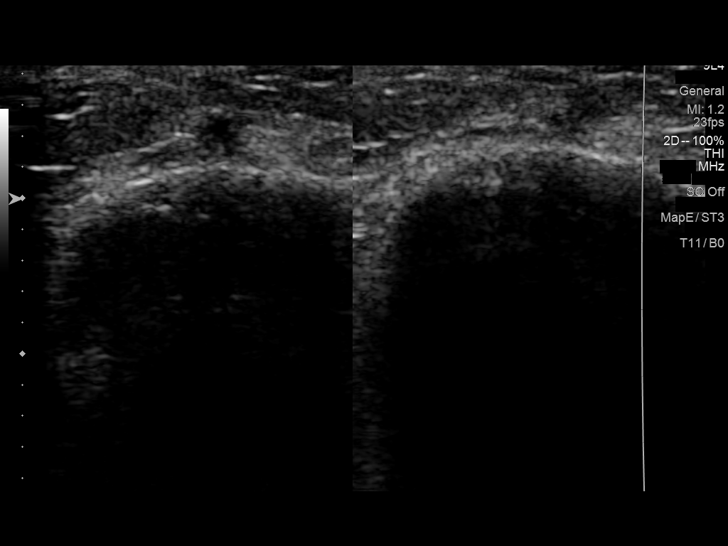

[13 of 24 positions shown; findings below may reference images not displayed]

FINDINGS: Contralateral Common Femoral Vein: Respiratory phasicity is normal
and symmetric with the symptomatic side. No evidence of thrombus.
Normal compressibility.

Common Femoral Vein: No evidence of thrombus. Normal
compressibility, respiratory phasicity and response to augmentation.

Saphenofemoral Junction: No evidence of thrombus. Normal
compressibility and flow on color Doppler imaging.

Profunda Femoral Vein: No evidence of thrombus. Normal
compressibility and flow on color Doppler imaging.

Femoral Vein: No evidence of thrombus. Normal compressibility,
respiratory phasicity and response to augmentation.

Popliteal Vein: No evidence of thrombus. Normal compressibility,
respiratory phasicity and response to augmentation.

Calf Veins: No evidence of thrombus. Normal compressibility and flow
on color Doppler imaging.

Superficial Great Saphenous Vein: No evidence of thrombus. Normal
compressibility.

Venous Reflux:  None.

Other Findings:  None.
IMPRESSION: No evidence of deep venous thrombosis.

## 2018-06-08 DIAGNOSIS — M9901 Segmental and somatic dysfunction of cervical region: Secondary | ICD-10-CM | POA: Diagnosis not present

## 2018-06-08 DIAGNOSIS — M9902 Segmental and somatic dysfunction of thoracic region: Secondary | ICD-10-CM | POA: Diagnosis not present

## 2018-06-08 DIAGNOSIS — R51 Headache: Secondary | ICD-10-CM | POA: Diagnosis not present

## 2018-06-29 MED FILL — NORETHIN-ESTRAD-FERR 1-0.02: 1-20 | 28 days supply | Qty: 28 | Fill #0

## 2018-07-06 DIAGNOSIS — R51 Headache: Secondary | ICD-10-CM | POA: Diagnosis not present

## 2018-07-06 DIAGNOSIS — M9902 Segmental and somatic dysfunction of thoracic region: Secondary | ICD-10-CM | POA: Diagnosis not present

## 2018-07-06 DIAGNOSIS — M9901 Segmental and somatic dysfunction of cervical region: Secondary | ICD-10-CM | POA: Diagnosis not present

## 2018-07-19 ENCOUNTER — Other Ambulatory Visit: Payer: Self-pay | Admitting: Adult Health

## 2018-07-19 MED FILL — LEVOTHYROXINE 25 MCG TABLET: 25 | 90 days supply | Qty: 90 | Fill #0

## 2018-07-21 MED FILL — BLISOVI FE 1/20 1-20 MG-MCG: 1-20 | 84 days supply | Qty: 84 | Fill #1

## 2018-07-27 ENCOUNTER — Telehealth: Payer: 59 | Admitting: Family Medicine

## 2018-07-27 DIAGNOSIS — J019 Acute sinusitis, unspecified: Secondary | ICD-10-CM

## 2018-07-27 DIAGNOSIS — B9789 Other viral agents as the cause of diseases classified elsewhere: Secondary | ICD-10-CM | POA: Diagnosis not present

## 2018-07-27 MED ORDER — IPRATROPIUM BROMIDE 0.03 % NA SOLN: 2.0000 | Freq: Three times a day (TID) | NASAL | 12 refills | Status: DC

## 2018-07-27 MED FILL — IPRATROPIUM 0.03% SPRAY: 0.03 | 28 days supply | Qty: 30 | Fill #0

## 2018-07-27 NOTE — Progress Notes (Signed)
We are sorry that you are not feeling well.  Here is how we plan to help!  Based on what you have shared with me it looks like you have sinusitis.  Sinusitis is inflammation and infection in the sinus cavities of the head.  Based on your presentation I believe you most likely have Acute Viral Sinusitis.This is an infection most likely caused by a virus. There is not specific treatment for viral sinusitis other than to help you with the symptoms until the infection runs its course.  You may use an oral decongestant such as Mucinex D or if you have glaucoma or high blood pressure use plain Mucinex. Saline nasal spray help and can safely be used as often as needed for congestion, I have prescribed: Ipratropium Bromide nasal spray 0.03% 2 sprays in eah nostril 2-3 times a day   It is early in the course of your symptoms if you feel that the condition is bacterial and feel that you need an antibiotic I recommended face to face evaluation.  Some authorities believe that zinc sprays or the use of Echinacea may shorten the course of your symptoms.  Sinus infections are not as easily transmitted as other respiratory infection, however we still recommend that you avoid close contact with loved ones, especially the very young and elderly.  Remember to wash your hands thoroughly throughout the day as this is the number one way to prevent the spread of infection!  Home Care:  Only take medications as instructed by your medical team.  Do not take these medications with alcohol.  A steam or ultrasonic humidifier can help congestion.  You can place a towel over your head and breathe in the steam from hot water coming from a faucet.  Avoid close contacts especially the very young and the elderly.  Cover your mouth when you cough or sneeze.  Always remember to wash your hands.  Get Help Right Away If:  You develop worsening fever or sinus pain.  You develop a severe head ache or visual changes.  Your  symptoms persist after you have completed your treatment plan.  Make sure you  Understand these instructions.  Will watch your condition.  Will get help right away if you are not doing well or get worse.  Your e-visit answers were reviewed by a board certified advanced clinical practitioner to complete your personal care plan.  Depending on the condition, your plan could have included both over the counter or prescription medications.  If there is a problem please reply  once you have received a response from your provider.  Your safety is important to [place].  If you have drug allergies check your prescription carefully.    You can use MyChart to ask questions about today's visit, request a non-urgent call back, or ask for a work or school excuse for 24 hours related to this e-Visit. If it has been greater than 24 hours you will need to follow up with your provider, or enter a new e-Visit to address those concerns.  You will get an e-mail in the next two days asking about your experience.  I hope that your e-visit has been valuable and will speed your recovery. Thank you for using e-visits.

## 2018-08-03 DIAGNOSIS — R51 Headache: Secondary | ICD-10-CM | POA: Diagnosis not present

## 2018-08-03 DIAGNOSIS — M9901 Segmental and somatic dysfunction of cervical region: Secondary | ICD-10-CM | POA: Diagnosis not present

## 2018-08-03 DIAGNOSIS — M9902 Segmental and somatic dysfunction of thoracic region: Secondary | ICD-10-CM | POA: Diagnosis not present

## 2018-08-04 DIAGNOSIS — Z01419 Encounter for gynecological examination (general) (routine) without abnormal findings: Secondary | ICD-10-CM | POA: Diagnosis not present

## 2018-08-04 DIAGNOSIS — Z6826 Body mass index (BMI) 26.0-26.9, adult: Secondary | ICD-10-CM | POA: Diagnosis not present

## 2018-08-20 DIAGNOSIS — Z872 Personal history of diseases of the skin and subcutaneous tissue: Secondary | ICD-10-CM | POA: Diagnosis not present

## 2018-08-20 DIAGNOSIS — L84 Corns and callosities: Secondary | ICD-10-CM | POA: Diagnosis not present

## 2018-08-20 DIAGNOSIS — D225 Melanocytic nevi of trunk: Secondary | ICD-10-CM | POA: Diagnosis not present

## 2018-08-20 DIAGNOSIS — L814 Other melanin hyperpigmentation: Secondary | ICD-10-CM | POA: Diagnosis not present

## 2018-08-20 DIAGNOSIS — L821 Other seborrheic keratosis: Secondary | ICD-10-CM | POA: Diagnosis not present

## 2018-08-20 DIAGNOSIS — D1801 Hemangioma of skin and subcutaneous tissue: Secondary | ICD-10-CM | POA: Diagnosis not present

## 2018-08-31 DIAGNOSIS — M9901 Segmental and somatic dysfunction of cervical region: Secondary | ICD-10-CM | POA: Diagnosis not present

## 2018-08-31 DIAGNOSIS — R51 Headache: Secondary | ICD-10-CM | POA: Diagnosis not present

## 2018-08-31 DIAGNOSIS — M9902 Segmental and somatic dysfunction of thoracic region: Secondary | ICD-10-CM | POA: Diagnosis not present

## 2018-09-28 DIAGNOSIS — R51 Headache: Secondary | ICD-10-CM | POA: Diagnosis not present

## 2018-09-28 DIAGNOSIS — M9901 Segmental and somatic dysfunction of cervical region: Secondary | ICD-10-CM | POA: Diagnosis not present

## 2018-09-28 DIAGNOSIS — M9902 Segmental and somatic dysfunction of thoracic region: Secondary | ICD-10-CM | POA: Diagnosis not present

## 2018-10-18 MED FILL — BLISOVI FE 1/20 1-20 MG-MCG: 1-20 | 84 days supply | Qty: 84 | Fill #2

## 2018-10-18 MED FILL — LEVOTHYROXINE 25 MCG TABLET: 25 | 90 days supply | Qty: 90 | Fill #1

## 2018-10-26 DIAGNOSIS — M9902 Segmental and somatic dysfunction of thoracic region: Secondary | ICD-10-CM | POA: Diagnosis not present

## 2018-10-26 DIAGNOSIS — M9903 Segmental and somatic dysfunction of lumbar region: Secondary | ICD-10-CM | POA: Diagnosis not present

## 2018-10-26 DIAGNOSIS — M9901 Segmental and somatic dysfunction of cervical region: Secondary | ICD-10-CM | POA: Diagnosis not present

## 2018-10-26 DIAGNOSIS — R51 Headache: Secondary | ICD-10-CM | POA: Diagnosis not present

## 2018-10-26 DIAGNOSIS — M5431 Sciatica, right side: Secondary | ICD-10-CM | POA: Diagnosis not present

## 2018-10-28 DIAGNOSIS — M9902 Segmental and somatic dysfunction of thoracic region: Secondary | ICD-10-CM | POA: Diagnosis not present

## 2018-10-28 DIAGNOSIS — M9903 Segmental and somatic dysfunction of lumbar region: Secondary | ICD-10-CM | POA: Diagnosis not present

## 2018-10-28 DIAGNOSIS — M5431 Sciatica, right side: Secondary | ICD-10-CM | POA: Diagnosis not present

## 2018-10-28 DIAGNOSIS — R51 Headache: Secondary | ICD-10-CM | POA: Diagnosis not present

## 2018-10-28 DIAGNOSIS — M9901 Segmental and somatic dysfunction of cervical region: Secondary | ICD-10-CM | POA: Diagnosis not present

## 2018-11-02 DIAGNOSIS — M9901 Segmental and somatic dysfunction of cervical region: Secondary | ICD-10-CM | POA: Diagnosis not present

## 2018-11-02 DIAGNOSIS — M9902 Segmental and somatic dysfunction of thoracic region: Secondary | ICD-10-CM | POA: Diagnosis not present

## 2018-11-02 DIAGNOSIS — M9903 Segmental and somatic dysfunction of lumbar region: Secondary | ICD-10-CM | POA: Diagnosis not present

## 2018-11-02 DIAGNOSIS — M5431 Sciatica, right side: Secondary | ICD-10-CM | POA: Diagnosis not present

## 2018-11-02 DIAGNOSIS — R51 Headache: Secondary | ICD-10-CM | POA: Diagnosis not present

## 2018-11-04 DIAGNOSIS — M5431 Sciatica, right side: Secondary | ICD-10-CM | POA: Diagnosis not present

## 2018-11-04 DIAGNOSIS — M9901 Segmental and somatic dysfunction of cervical region: Secondary | ICD-10-CM | POA: Diagnosis not present

## 2018-11-04 DIAGNOSIS — R51 Headache: Secondary | ICD-10-CM | POA: Diagnosis not present

## 2018-11-04 DIAGNOSIS — M9903 Segmental and somatic dysfunction of lumbar region: Secondary | ICD-10-CM | POA: Diagnosis not present

## 2018-11-04 DIAGNOSIS — M9902 Segmental and somatic dysfunction of thoracic region: Secondary | ICD-10-CM | POA: Diagnosis not present

## 2018-11-11 DIAGNOSIS — M5431 Sciatica, right side: Secondary | ICD-10-CM | POA: Diagnosis not present

## 2018-11-11 DIAGNOSIS — M9902 Segmental and somatic dysfunction of thoracic region: Secondary | ICD-10-CM | POA: Diagnosis not present

## 2018-11-11 DIAGNOSIS — R51 Headache: Secondary | ICD-10-CM | POA: Diagnosis not present

## 2018-11-11 DIAGNOSIS — M9903 Segmental and somatic dysfunction of lumbar region: Secondary | ICD-10-CM | POA: Diagnosis not present

## 2018-11-11 DIAGNOSIS — M9901 Segmental and somatic dysfunction of cervical region: Secondary | ICD-10-CM | POA: Diagnosis not present

## 2018-11-11 IMAGING — US US THYROID
1 series · 13 of 25 positions shown · non-contrast
Comparison: Prior thyroid ultrasound 07/23/2015 and 07/14/2014

CLINICAL DATA: Goiter. 30-year-old female with hypothyroidism and
goiter. She is on low-dose thyroid hormone replacement therapy.

EXAM:
THYROID ULTRASOUND
TECHNIQUE: Ultrasound examination of the thyroid gland and adjacent soft
tissues was performed.

[Series 1: us thyroid · 0.06mm/px · 13 of 44 slices shown]
[im 1/44]
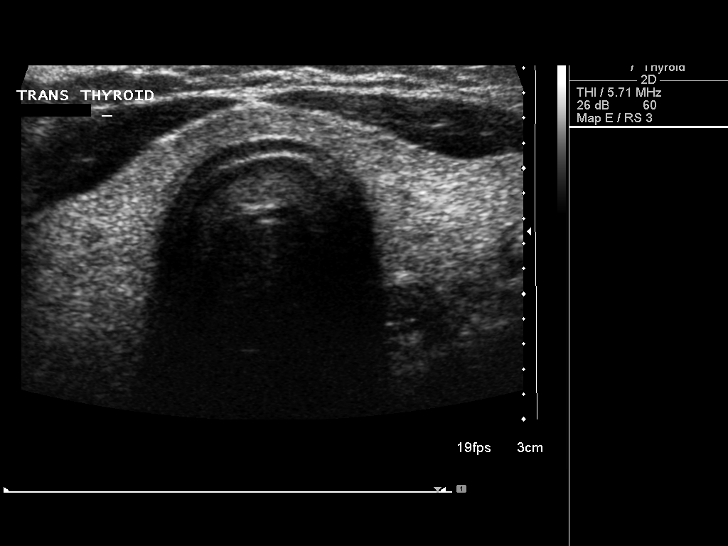
[im 4/44]
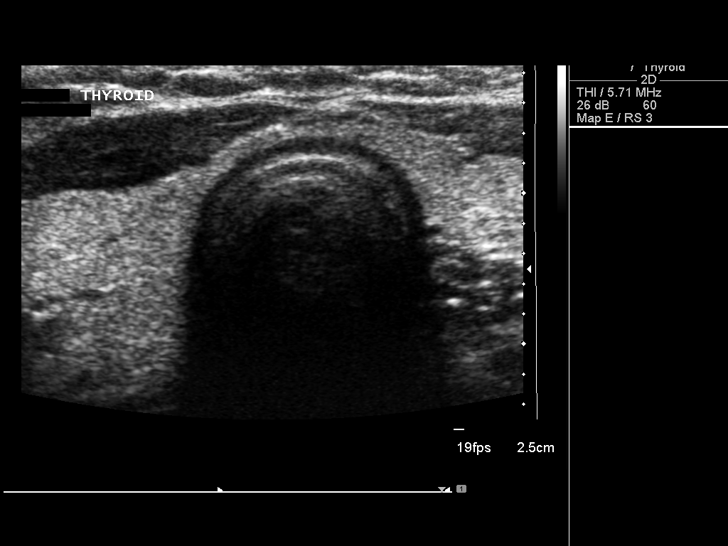
[im 8/44]
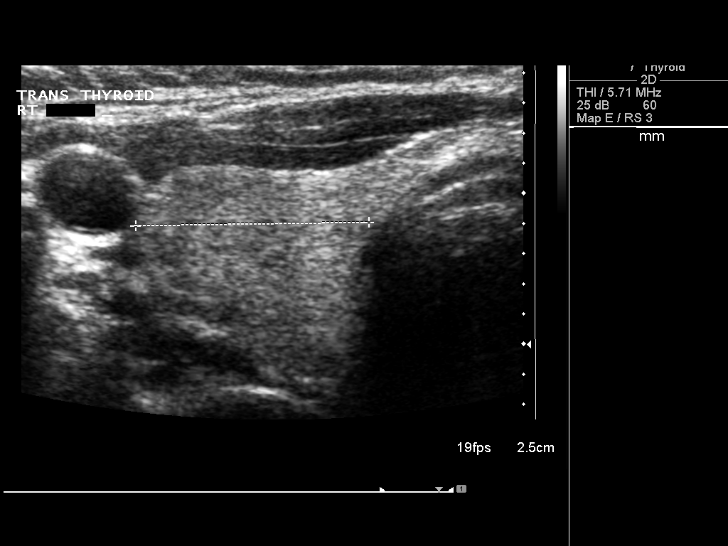
[im 11/44]
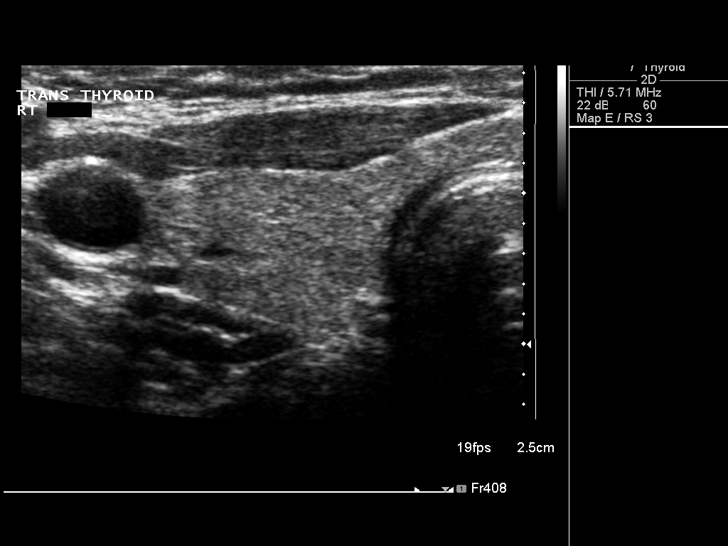
[im 15/44]
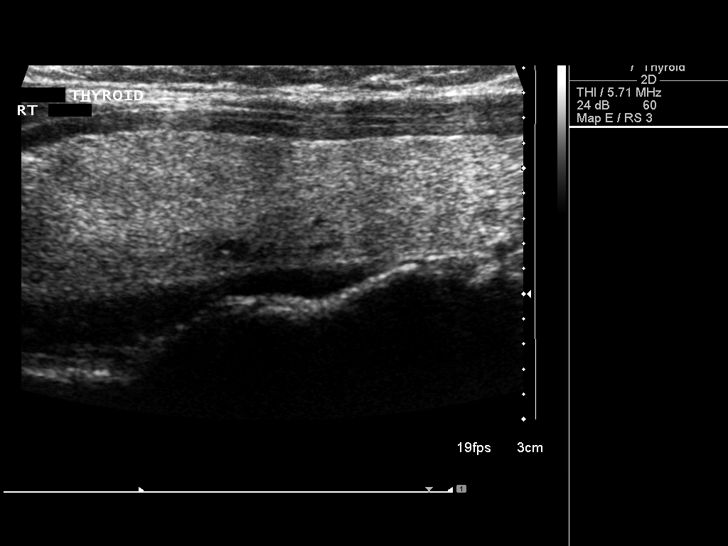
[im 18/44]
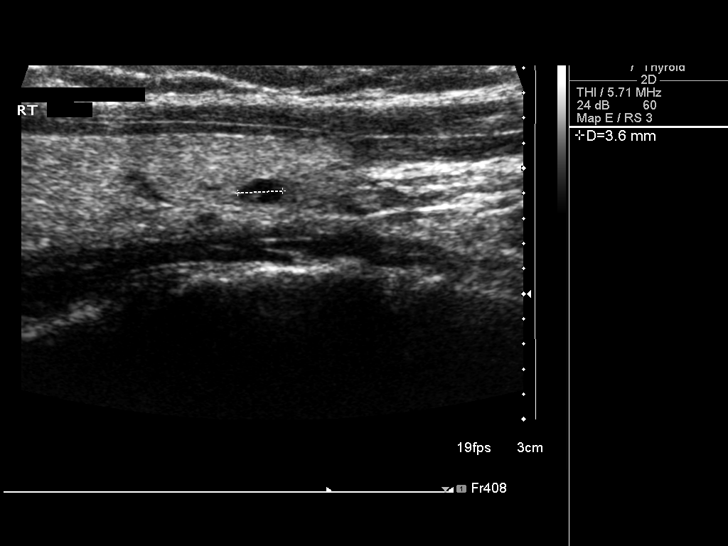
[im 22/44]
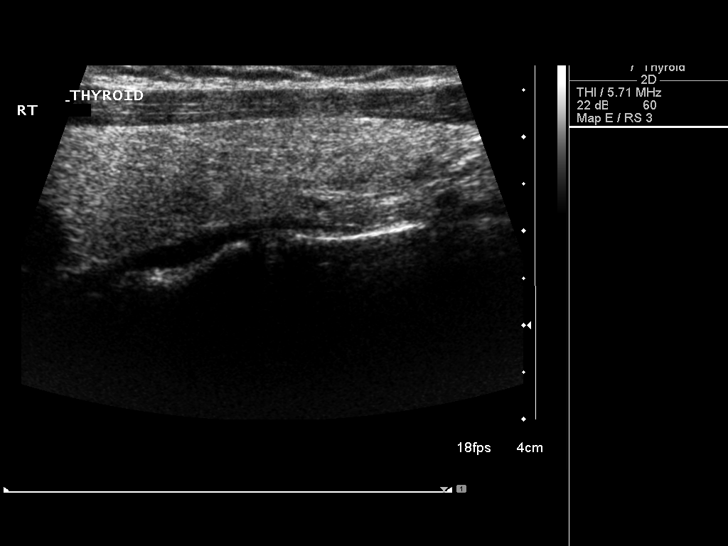
[im 26/44]
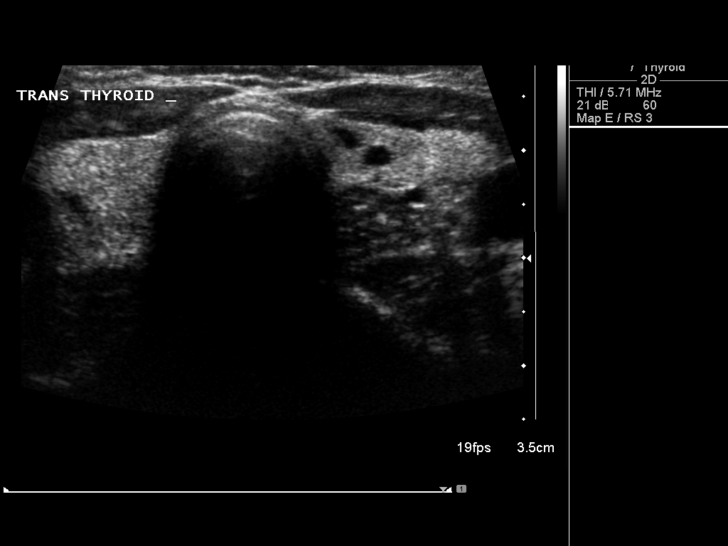
[im 29/44]
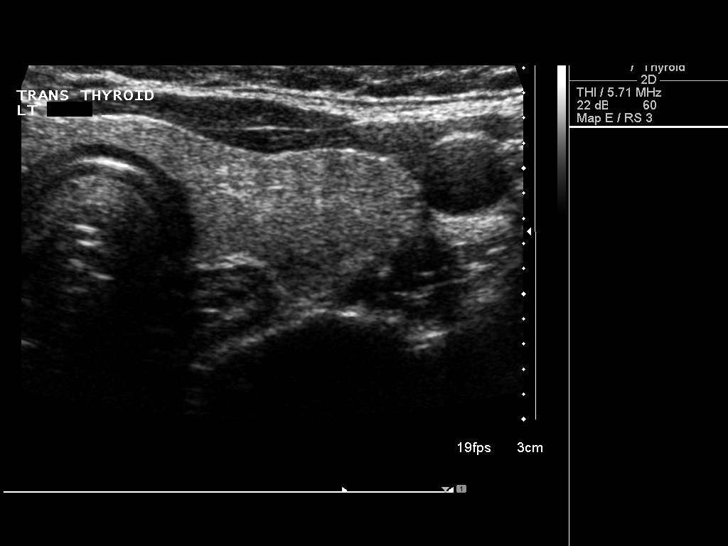
[im 33/44]
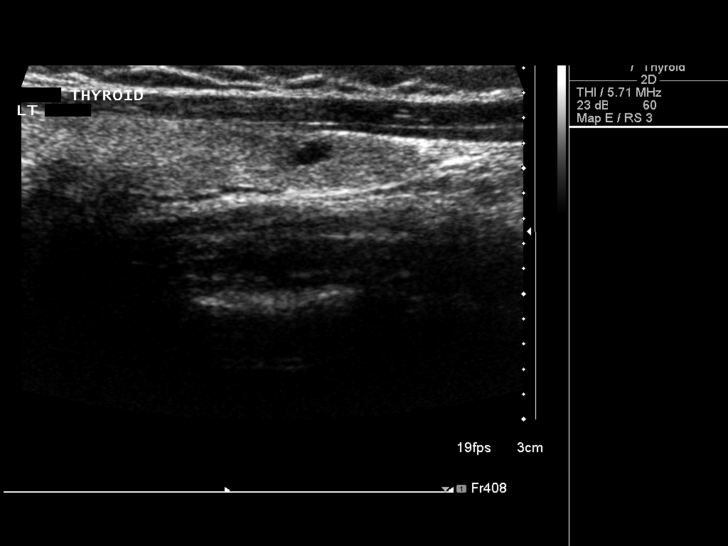
[im 36/44]
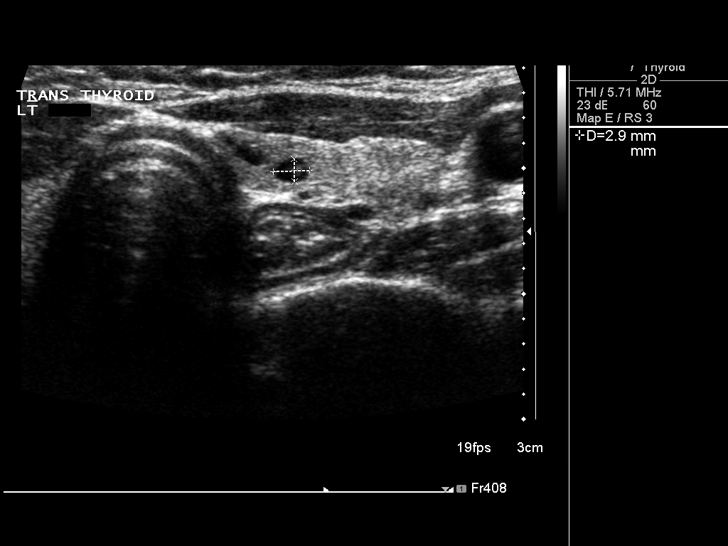
[im 40/44]
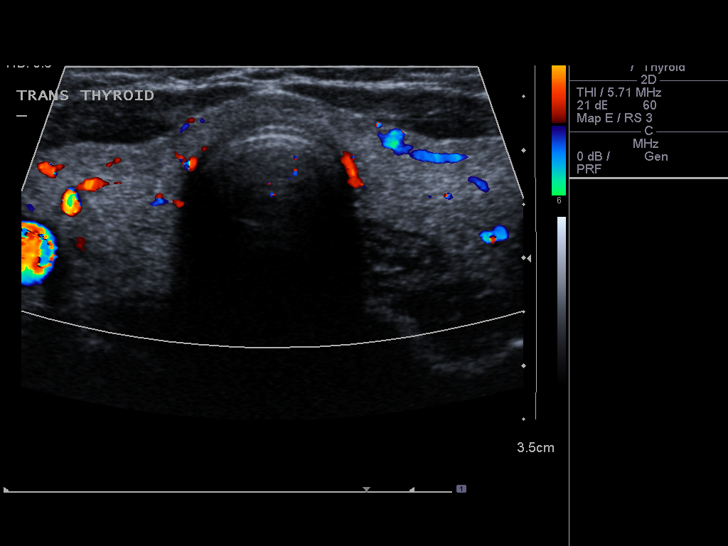
[im 44/44]
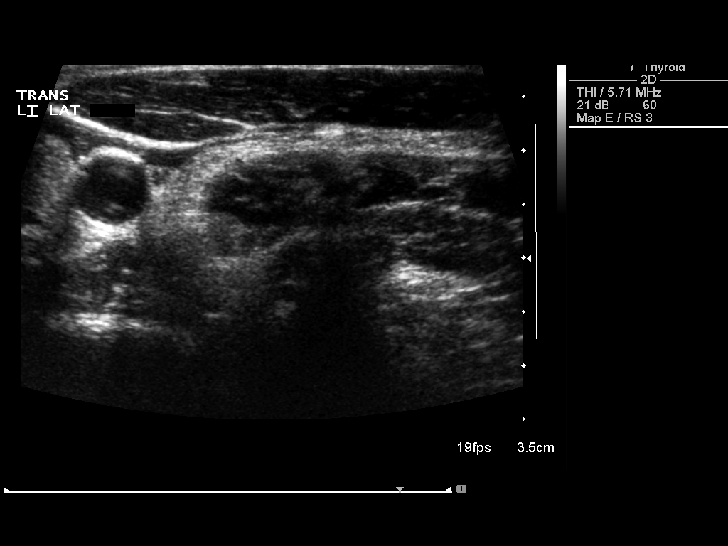

[13 of 25 positions shown; findings below may reference images not displayed]

FINDINGS: Parenchymal Echotexture: Normal

Estimated total number of nodules >/= 1 cm: 0

Number of spongiform nodules >/=  2 cm not described below (TR1): 0

Number of mixed cystic and solid nodules >/= 1.5 cm not described
below (TR2): 0

_________________________________________________________

Isthmus: 0.3 cm

No discrete nodules are identified within the thyroid isthmus.

_________________________________________________________

Right lobe: 5.4 x 1.4 x 1.6 cm

Several tiny (less than 5 mm) hypoechoic thyroid nodules several of
which demonstrate colloid artifact. These nodules have no internal
suspicious features and do not meet guidelines for dedicated imaging
follow-up or biopsy.

_________________________________________________________

Left lobe: 5.4 x 1.1 x 1.9 cm

Several tiny (less than 5 mm) hypoechoic thyroid nodules several of
which demonstrate colloid artifact. These nodules have no internal
suspicious features and do not meet guidelines for dedicated imaging
follow-up or biopsy.
IMPRESSION: Tiny (less than 5 mm) bilateral thyroid nodules which demonstrate no
significant interval change compared to prior imaging and no
suspicious features. These nodules do not meet guidelines for either
continued dedicated imaging follow-up or biopsy.

The above is in keeping with the ACR TI-RADS recommendations - [HOSPITAL] 8505;[DATE].

## 2018-11-24 ENCOUNTER — Encounter: Payer: 59 | Admitting: Adult Health

## 2018-12-22 DIAGNOSIS — M9901 Segmental and somatic dysfunction of cervical region: Secondary | ICD-10-CM | POA: Diagnosis not present

## 2018-12-22 DIAGNOSIS — M5431 Sciatica, right side: Secondary | ICD-10-CM | POA: Diagnosis not present

## 2018-12-22 DIAGNOSIS — M9902 Segmental and somatic dysfunction of thoracic region: Secondary | ICD-10-CM | POA: Diagnosis not present

## 2018-12-22 DIAGNOSIS — R51 Headache: Secondary | ICD-10-CM | POA: Diagnosis not present

## 2018-12-22 DIAGNOSIS — M9903 Segmental and somatic dysfunction of lumbar region: Secondary | ICD-10-CM | POA: Diagnosis not present

## 2018-12-31 ENCOUNTER — Telehealth: Payer: 59 | Admitting: Family

## 2018-12-31 DIAGNOSIS — B9689 Other specified bacterial agents as the cause of diseases classified elsewhere: Secondary | ICD-10-CM | POA: Diagnosis not present

## 2018-12-31 DIAGNOSIS — J019 Acute sinusitis, unspecified: Secondary | ICD-10-CM

## 2018-12-31 MED ORDER — AMOXICILLIN-POT CLAVULANATE 875-125 MG PO TABS: 1.0000 | ORAL_TABLET | Freq: Two times a day (BID) | ORAL | 0 refills | Status: DC

## 2018-12-31 NOTE — Progress Notes (Signed)

## 2019-01-10 ENCOUNTER — Other Ambulatory Visit: Payer: Self-pay | Admitting: Adult Health

## 2019-01-10 MED FILL — BLISOVI FE 1/20 1-20 MG-MCG: 1-20 | 84 days supply | Qty: 84 | Fill #0 | Status: TO

## 2019-01-10 NOTE — Telephone Encounter (Signed)
Sent MyChart message to pt re: need for TSH prior to refills.  Awaiting pt's response.  Charyl Bigger, CMA

## 2019-01-17 ENCOUNTER — Other Ambulatory Visit: Payer: Self-pay

## 2019-01-17 ENCOUNTER — Other Ambulatory Visit (INDEPENDENT_AMBULATORY_CARE_PROVIDER_SITE_OTHER): Payer: 59

## 2019-01-17 DIAGNOSIS — Z0000 Encounter for general adult medical examination without abnormal findings: Secondary | ICD-10-CM

## 2019-01-18 ENCOUNTER — Other Ambulatory Visit: Payer: Self-pay | Admitting: Adult Health

## 2019-01-18 DIAGNOSIS — M5431 Sciatica, right side: Secondary | ICD-10-CM | POA: Diagnosis not present

## 2019-01-18 DIAGNOSIS — M9903 Segmental and somatic dysfunction of lumbar region: Secondary | ICD-10-CM | POA: Diagnosis not present

## 2019-01-18 DIAGNOSIS — R51 Headache: Secondary | ICD-10-CM | POA: Diagnosis not present

## 2019-01-18 DIAGNOSIS — M9902 Segmental and somatic dysfunction of thoracic region: Secondary | ICD-10-CM | POA: Diagnosis not present

## 2019-01-18 DIAGNOSIS — M9901 Segmental and somatic dysfunction of cervical region: Secondary | ICD-10-CM | POA: Diagnosis not present

## 2019-01-18 LAB — TSH: TSH: 1.23 u[IU]/mL (ref 0.450–4.500)

## 2019-01-18 MED ORDER — LEVOTHYROXINE SODIUM 25 MCG PO TABS: ORAL_TABLET | ORAL | 2 refills | Status: DC

## 2019-01-18 MED FILL — LEVOTHYROXINE 25 MCG TABLET: 25 | 90 days supply | Qty: 90 | Fill #0 | Status: TO

## 2019-01-20 ENCOUNTER — Encounter: Payer: 59 | Admitting: Adult Health

## 2019-02-15 DIAGNOSIS — R51 Headache: Secondary | ICD-10-CM | POA: Diagnosis not present

## 2019-02-15 DIAGNOSIS — M5431 Sciatica, right side: Secondary | ICD-10-CM | POA: Diagnosis not present

## 2019-02-15 DIAGNOSIS — M9903 Segmental and somatic dysfunction of lumbar region: Secondary | ICD-10-CM | POA: Diagnosis not present

## 2019-02-15 DIAGNOSIS — M9902 Segmental and somatic dysfunction of thoracic region: Secondary | ICD-10-CM | POA: Diagnosis not present

## 2019-02-15 DIAGNOSIS — M9901 Segmental and somatic dysfunction of cervical region: Secondary | ICD-10-CM | POA: Diagnosis not present

## 2019-02-22 DIAGNOSIS — H5212 Myopia, left eye: Secondary | ICD-10-CM | POA: Diagnosis not present

## 2019-03-04 NOTE — Progress Notes (Signed)
Greater than 5 minutes, yet less than 10 minutes of time have been spent researching, coordinating, and implementing care for this patient today.  Thank you for the details you included in the comment boxes. Those details are very helpful in determining the best course of treatment for you and help us to provide the best care.  

## 2019-03-10 MED FILL — IPRATROPIUM 0.03% SPRAY: 0.03 | 28 days supply | Qty: 30 | Fill #1

## 2019-03-14 ENCOUNTER — Other Ambulatory Visit: Payer: 59

## 2019-03-14 ENCOUNTER — Other Ambulatory Visit: Payer: Self-pay

## 2019-03-14 DIAGNOSIS — R7303 Prediabetes: Secondary | ICD-10-CM

## 2019-03-14 DIAGNOSIS — Z0000 Encounter for general adult medical examination without abnormal findings: Secondary | ICD-10-CM

## 2019-03-15 LAB — CBC WITH DIFFERENTIAL/PLATELET
Basophils Absolute: 0 10*3/uL (ref 0.0–0.2)
Basos: 0 %
EOS (ABSOLUTE): 0.1 10*3/uL (ref 0.0–0.4)
Eos: 1 %
Hematocrit: 42.5 % (ref 34.0–46.6)
Hemoglobin: 14.3 g/dL (ref 11.1–15.9)
Immature Grans (Abs): 0 10*3/uL (ref 0.0–0.1)
Immature Granulocytes: 0 %
Lymphocytes Absolute: 2.3 10*3/uL (ref 0.7–3.1)
Lymphs: 40 %
MCH: 31.2 pg (ref 26.6–33.0)
MCHC: 33.6 g/dL (ref 31.5–35.7)
MCV: 93 fL (ref 79–97)
Monocytes Absolute: 0.4 10*3/uL (ref 0.1–0.9)
Monocytes: 7 %
Neutrophils Absolute: 3 10*3/uL (ref 1.4–7.0)
Neutrophils: 52 %
Platelets: 286 10*3/uL (ref 150–450)
RBC: 4.59 x10E6/uL (ref 3.77–5.28)
RDW: 12.6 % (ref 11.7–15.4)
WBC: 5.7 10*3/uL (ref 3.4–10.8)

## 2019-03-15 LAB — COMPREHENSIVE METABOLIC PANEL
ALT: 11 IU/L (ref 0–32)
AST: 16 IU/L (ref 0–40)
Albumin/Globulin Ratio: 1.8 (ref 1.2–2.2)
Albumin: 4.3 g/dL (ref 3.8–4.8)
Alkaline Phosphatase: 40 IU/L (ref 39–117)
BUN/Creatinine Ratio: 19 (ref 9–23)
BUN: 15 mg/dL (ref 6–20)
Bilirubin Total: 0.4 mg/dL (ref 0.0–1.2)
CO2: 23 mmol/L (ref 20–29)
Calcium: 9.1 mg/dL (ref 8.7–10.2)
Chloride: 102 mmol/L (ref 96–106)
Creatinine, Ser: 0.81 mg/dL (ref 0.57–1.00)
GFR calc Af Amer: 110 mL/min/{1.73_m2} (ref >59)
GFR calc non Af Amer: 96 mL/min/{1.73_m2} (ref >59)
Globulin, Total: 2.4 g/dL (ref 1.5–4.5)
Glucose: 91 mg/dL (ref 65–99)
Potassium: 4.4 mmol/L (ref 3.5–5.2)
Sodium: 137 mmol/L (ref 134–144)
Total Protein: 6.7 g/dL (ref 6.0–8.5)

## 2019-03-15 LAB — TSH: TSH: 1.69 u[IU]/mL (ref 0.450–4.500)

## 2019-03-15 LAB — HEMOGLOBIN A1C
Est. average glucose Bld gHb Est-mCnc: 100 mg/dL
Hgb A1c MFr Bld: 5.1 % (ref 4.8–5.6)

## 2019-03-15 LAB — LIPID PANEL
Chol/HDL Ratio: 2.8 ratio (ref 0.0–4.4)
Cholesterol, Total: 155 mg/dL (ref 100–199)
HDL: 56 mg/dL (ref >39)
LDL Calculated: 78 mg/dL (ref 0–99)
Triglycerides: 107 mg/dL (ref 0–149)
VLDL Cholesterol Cal: 21 mg/dL (ref 5–40)

## 2019-03-16 DIAGNOSIS — R51 Headache: Secondary | ICD-10-CM | POA: Diagnosis not present

## 2019-03-16 DIAGNOSIS — M9902 Segmental and somatic dysfunction of thoracic region: Secondary | ICD-10-CM | POA: Diagnosis not present

## 2019-03-16 DIAGNOSIS — M5431 Sciatica, right side: Secondary | ICD-10-CM | POA: Diagnosis not present

## 2019-03-16 DIAGNOSIS — M9901 Segmental and somatic dysfunction of cervical region: Secondary | ICD-10-CM | POA: Diagnosis not present

## 2019-03-16 DIAGNOSIS — M9903 Segmental and somatic dysfunction of lumbar region: Secondary | ICD-10-CM | POA: Diagnosis not present

## 2019-03-25 ENCOUNTER — Other Ambulatory Visit: Payer: 59

## 2019-03-29 NOTE — Progress Notes (Deleted)
   Subjective:    Patient ID: Jacqueline Chen, female    DOB: Aug 24, 1986, 33 y.o.   MRN: [id number]  HPI:  Jacqueline Chen is here for CPE  03/14/2019 Labs: TSH-WNL, 1.690  Lipid Panel-  TOt- 155  TGs- 107  HDL-56  LDL-78  A1c-5.1  CMP-stable  CBC-stable   Healthcare Maintenance: PAP- Immunizations-   Patient Care Team    Relationship Specialty Notifications Start End  [place], Valetta Fuller D, NP PCP - General Family Medicine  08/10/17   Louretta Shorten, MD Consulting Physician Obstetrics and Gynecology  08/10/17   Druscilla Brownie, [place] Physician Dermatology  08/10/17     Patient Active Problem List   Diagnosis Date Noted  . Mass of leg, right 01/15/2018  . Pain in right leg 01/15/2018  . Prediabetes 11/18/2017  . Healthcare maintenance 08/10/2017  . PROM (premature rupture of membranes) 06/16/2017  . Delivery normal 04/16/2014     Past Medical History:  Diagnosis Date  . Goiter 2015   multinodular goiter     No past surgical history on file.   Family History  Problem Relation Age of Onset  . Healthy Mother   . Heart attack Father   . Hyperlipidemia Father   . Hypertension Father   . Healthy Sister   . Healthy Brother   . Healthy Daughter   . Cancer Paternal Grandmother        breast  . Healthy Daughter      Social History   Substance and Sexual Activity  Drug Use No     Social History   Substance and Sexual Activity  Alcohol Use No  . Frequency: Never   Comment: currently breast feeding but has 1 drink/wk when not breastfeeding     Social History   Tobacco Use  Smoking Status Never Smoker  Smokeless Tobacco Never Used     Outpatient Encounter Medications as of 03/31/2019  Medication Sig  . amoxicillin-clavulanate (AUGMENTIN) 875-125 MG tablet Take 1 tablet by mouth 2 (two) times daily.  . cetirizine (ZYRTEC) 10 MG tablet Take 10 mg by mouth daily.  Marland Kitchen ipratropium (ATROVENT) 0.03 % nasal spray Place 2 sprays into both nostrils 3 (three)  times daily.  Marland Kitchen levothyroxine (SYNTHROID) 25 MCG tablet TAKE 1 TABLET BY MOUTH DAILY BEFORE BREAKFAST.  Marland Kitchen norethindrone (MICRONOR,CAMILA,ERRIN) 0.35 MG tablet See admin instructions.  . Prenatal Vit-Fe Fumarate-FA (PRENATAL MULTIVITAMIN) TABS tablet Take 1 tablet by mouth daily at 12 noon.   No facility-administered encounter medications on file as of 03/31/2019.     Allergies: Patient has no known allergies.  There is no height or weight on file to calculate BMI.  currently breastfeeding.     Review of Systems     Objective:   Physical Exam        Assessment & Plan:  No diagnosis found.  No problem-specific Assessment & Plan notes found for this encounter.    FOLLOW-UP:  No follow-ups on file.

## 2019-03-31 ENCOUNTER — Encounter: Payer: 59 | Admitting: Adult Health

## 2019-03-31 MED FILL — BLISOVI FE 1/20 1-20 MG-MCG: 1-20 | 84 days supply | Qty: 84 | Fill #0

## 2019-04-04 NOTE — Progress Notes (Signed)
No, it can wait until August- all stable Thanks! Valetta Fuller

## 2019-04-11 DIAGNOSIS — M9903 Segmental and somatic dysfunction of lumbar region: Secondary | ICD-10-CM | POA: Diagnosis not present

## 2019-04-11 DIAGNOSIS — M5431 Sciatica, right side: Secondary | ICD-10-CM | POA: Diagnosis not present

## 2019-04-11 DIAGNOSIS — M9901 Segmental and somatic dysfunction of cervical region: Secondary | ICD-10-CM | POA: Diagnosis not present

## 2019-04-11 DIAGNOSIS — R51 Headache: Secondary | ICD-10-CM | POA: Diagnosis not present

## 2019-04-11 DIAGNOSIS — M9902 Segmental and somatic dysfunction of thoracic region: Secondary | ICD-10-CM | POA: Diagnosis not present

## 2019-04-18 MED FILL — LEVOTHYROXINE 25 MCG TABLET: 25 | 90 days supply | Qty: 90 | Fill #0

## 2019-05-09 NOTE — Progress Notes (Signed)
Subjective:    Patient ID: Jacqueline Chen, female    DOB: 05/28/1986, 33 y.o.   MRN: [id number]  HPI:  Jacqueline Chen is here for CPE She recently resumed regular exercise- running and weight lifting 4 days/week She drinks >80 oz water/day, follows heart healthy diet Continue to abstain from tobacco/vape/excessive ETOH use She denies acute issues today  03/14/2019 OV: TSH-WNL, 1.690  Lipid Panel-  TOt- 155  TGs- 107  HDL-56  LDL-78  A1c-5.1  CMP-stable  CBC-stable   Patient Care Team    Relationship Specialty Notifications Start End  Mina Marble D, NP PCP - General Family Medicine  08/10/17   Louretta Shorten, MD Consulting Physician Obstetrics and Gynecology  08/10/17   Druscilla Brownie, [place] Physician Dermatology  08/10/17     Patient Active Problem List   Diagnosis Date Noted  . Mass of leg, right 01/15/2018  . Pain in right leg 01/15/2018  . Prediabetes 11/18/2017  . Healthcare maintenance 08/10/2017  . PROM (premature rupture of membranes) 06/16/2017  . Delivery normal 04/16/2014     Past Medical History:  Diagnosis Date  . Goiter 2015   multinodular goiter     History reviewed. No pertinent surgical history.   Family History  Problem Relation Age of Onset  . Healthy Mother   . Heart attack Father   . Hyperlipidemia Father   . Hypertension Father   . Healthy Sister   . Healthy Brother   . Healthy Daughter   . Cancer Paternal Grandmother        breast  . Healthy Daughter      Social History   Substance and Sexual Activity  Drug Use No     Social History   Substance and Sexual Activity  Alcohol Use No  . Frequency: Never   Comment: currently breast feeding but has 1 drink/wk when not breastfeeding     Social History   Tobacco Use  Smoking Status Never Smoker  Smokeless Tobacco Never Used     Outpatient Encounter Medications as of 05/10/2019  Medication Sig  . cetirizine (ZYRTEC) 10 MG tablet Take 10 mg by mouth daily.  Marland Kitchen  ipratropium (ATROVENT) 0.03 % nasal spray Place 2 sprays into both nostrils 3 (three) times daily.  Marland Kitchen levothyroxine (SYNTHROID) 25 MCG tablet TAKE 1 TABLET BY MOUTH DAILY BEFORE BREAKFAST.  . Multiple Vitamins-Minerals (MULTIVITAMIN ADULT) CHEW Chew 1 each by mouth daily.  . norethindrone (MICRONOR,CAMILA,ERRIN) 0.35 MG tablet See admin instructions.  . [DISCONTINUED] amoxicillin-clavulanate (AUGMENTIN) 875-125 MG tablet Take 1 tablet by mouth 2 (two) times daily.  . [DISCONTINUED] Prenatal Vit-Fe Fumarate-FA (PRENATAL MULTIVITAMIN) TABS tablet Take 1 tablet by mouth daily at 12 noon.   No facility-administered encounter medications on file as of 05/10/2019.     Allergies: Patient has no known allergies.  Body mass index is 27.61 kg/m.  Blood pressure 93/65, pulse 70, temperature 98.8 F (37.1 C), temperature source Oral, height 5\' 7"  (1.702 m), weight 176 lb 4.8 oz (80 kg), SpO2 99 %, currently breastfeeding.   Review of Systems  Constitutional: Positive for fatigue. Negative for activity change, appetite change, chills, diaphoresis, fever and unexpected weight change.  HENT: Negative for congestion.   Eyes: Negative for visual disturbance.  Respiratory: Negative for cough, chest tightness, shortness of breath, wheezing and stridor.   Cardiovascular: Negative for chest pain, palpitations and leg swelling.  Gastrointestinal: Negative for abdominal distention, anal bleeding, blood in stool, constipation, diarrhea, nausea and vomiting.  Endocrine: Negative  for cold intolerance, heat intolerance, polydipsia, polyphagia and polyuria.  Genitourinary: Negative for difficulty urinating and flank pain.  Musculoskeletal: Negative for arthralgias, back pain, gait problem, joint swelling, myalgias, neck pain and neck stiffness.  Skin: Negative for color change, pallor, rash and wound.  Neurological: Negative for dizziness and headaches.  Hematological: Negative for adenopathy. Does not  bruise/bleed easily.  Psychiatric/Behavioral: Negative for agitation, behavioral problems, confusion, decreased concentration, dysphoric mood, hallucinations, self-injury, sleep disturbance and suicidal ideas. The patient is not nervous/anxious and is not hyperactive.        Objective:   Physical Exam Vitals signs and nursing note reviewed.  Constitutional:      General: She is not in acute distress.    Appearance: Normal appearance. She is normal weight. She is not ill-appearing, toxic-appearing or diaphoretic.  HENT:     Head: Normocephalic and atraumatic.     Right Ear: Tympanic membrane, ear canal and external ear normal. There is no impacted cerumen.     Left Ear: Tympanic membrane, ear canal and external ear normal. There is no impacted cerumen.     Nose: Nose normal. No congestion.     Mouth/Throat:     Mouth: Mucous membranes are moist.     Pharynx: No oropharyngeal exudate or posterior oropharyngeal erythema.  Eyes:     Extraocular Movements: Extraocular movements intact.     Conjunctiva/sclera: Conjunctivae normal.     Pupils: Pupils are equal, round, and reactive to light.  Neck:     Musculoskeletal: Normal range of motion and neck supple.  Cardiovascular:     Rate and Rhythm: Normal rate and regular rhythm.     Pulses: Normal pulses.     Heart sounds: Normal heart sounds. No murmur. No friction rub. No gallop.   Pulmonary:     Effort: Pulmonary effort is normal. No respiratory distress.     Breath sounds: Normal breath sounds. No stridor. No wheezing, rhonchi or rales.  Chest:     Chest wall: No tenderness.  Abdominal:     General: Abdomen is flat. Bowel sounds are normal. There is no distension.     Palpations: Abdomen is soft. There is no mass.     Tenderness: There is no abdominal tenderness. There is no right CVA tenderness, left CVA tenderness, guarding or rebound.     Hernia: No hernia is present.  Musculoskeletal: Normal range of motion.        General: No  tenderness.  Skin:    General: Skin is warm and dry.     Capillary Refill: Capillary refill takes less than 2 seconds.  Neurological:     Mental Status: She is alert and oriented to person, place, and time.     Coordination: Coordination normal.  Psychiatric:        Mood and Affect: Mood normal.        Behavior: Behavior normal.        Thought Content: Thought content normal.        Judgment: Judgment normal.        Assessment & Plan:   1. Healthcare maintenance     Healthcare maintenance Overall your labs, blood pressure, and weight are all stable. Increase water intake, strive for at least 75 ounces/day.   Follow Heart Healthy diet Increase regular exercise.  Recommend at least 30 minutes daily, 5 days per week of walking, jogging, biking, swimming, YouTube/Pinterest workout videos. Recommend annual physical, fasting labs the week prior. Continue to social distance and wear  a mask when in public.    FOLLOW-UP:  Return in about 1 year (around 05/09/2020) for Fasting Labs, CPE.

## 2019-05-10 ENCOUNTER — Other Ambulatory Visit: Payer: Self-pay

## 2019-05-10 ENCOUNTER — Ambulatory Visit (INDEPENDENT_AMBULATORY_CARE_PROVIDER_SITE_OTHER): Payer: 59 | Admitting: Adult Health

## 2019-05-10 ENCOUNTER — Encounter: Payer: Self-pay | Admitting: Adult Health

## 2019-05-10 DIAGNOSIS — M9903 Segmental and somatic dysfunction of lumbar region: Secondary | ICD-10-CM | POA: Diagnosis not present

## 2019-05-10 DIAGNOSIS — M9901 Segmental and somatic dysfunction of cervical region: Secondary | ICD-10-CM | POA: Diagnosis not present

## 2019-05-10 DIAGNOSIS — M5431 Sciatica, right side: Secondary | ICD-10-CM | POA: Diagnosis not present

## 2019-05-10 DIAGNOSIS — Z0000 Encounter for general adult medical examination without abnormal findings: Secondary | ICD-10-CM | POA: Diagnosis not present

## 2019-05-10 DIAGNOSIS — M9902 Segmental and somatic dysfunction of thoracic region: Secondary | ICD-10-CM | POA: Diagnosis not present

## 2019-05-10 DIAGNOSIS — R51 Headache: Secondary | ICD-10-CM | POA: Diagnosis not present

## 2019-05-10 NOTE — Assessment & Plan Note (Signed)
Overall your labs, blood pressure, and weight are all stable. Increase water intake, strive for at least 75 ounces/day.   Follow Heart Healthy diet Increase regular exercise.  Recommend at least 30 minutes daily, 5 days per week of walking, jogging, biking, swimming, YouTube/[employer] workout videos. Recommend annual physical, fasting labs the week prior. Continue to social distance and wear a mask when in public.

## 2019-05-10 NOTE — Patient Instructions (Addendum)
Preventive Care for Adults, Female  A healthy lifestyle and preventive care can promote health and wellness. Preventive health guidelines for women include the following key practices.   A routine yearly physical is a good way to check with your health care provider about your health and preventive screening. It is a chance to share any concerns and updates on your health and to receive a thorough exam.   Visit your dentist for a routine exam and preventive care every 6 months. Brush your teeth twice a day and floss once a day. Good oral hygiene prevents tooth decay and gum disease.   The frequency of eye exams is based on your age, health, family medical history, use of contact lenses, and other factors. Follow your health care provider's recommendations for frequency of eye exams.   Eat a healthy diet. Foods like vegetables, fruits, whole grains, low-fat dairy products, and lean protein foods contain the nutrients you need without too many calories. Decrease your intake of foods high in solid fats, added sugars, and salt. Eat the right amount of calories for you.Get information about a proper diet from your health care provider, if necessary.   Regular physical exercise is one of the most important things you can do for your health. Most adults should get at least 150 minutes of moderate-intensity exercise (any activity that increases your heart rate and causes you to sweat) each week. In addition, most adults need muscle-strengthening exercises on 2 or more days a week.   Maintain a healthy weight. The body mass index (BMI) is a screening tool to identify possible weight problems. It provides an estimate of body fat based on height and weight. Your health care provider can find your BMI, and can help you achieve or maintain a healthy weight.For adults 20 years and older:   - A BMI below 18.5 is considered underweight.   - A BMI of 18.5 to 24.9 is normal.   - A BMI of 25 to 29.9 is  considered overweight.   - A BMI of 30 and above is considered obese.   Maintain normal blood lipids and cholesterol levels by exercising and minimizing your intake of trans and saturated fats.  Eat a balanced diet with plenty of fruit and vegetables. Blood tests for lipids and cholesterol should begin at age 20 and be repeated every 5 years minimum.  If your lipid or cholesterol levels are high, you are over 40, or you are at high risk for heart disease, you may need your cholesterol levels checked more frequently.Ongoing high lipid and cholesterol levels should be treated with medicines if diet and exercise are not working.   If you smoke, find out from your health care provider how to quit. If you do not use tobacco, do not start.   Lung cancer screening is recommended for adults aged 55-80 years who are at high risk for developing lung cancer because of a history of smoking. A yearly low-dose CT scan of the lungs is recommended for people who have at least a 30-pack-year history of smoking and are a current smoker or have quit within the past 15 years. A pack year of smoking is smoking an average of 1 pack of cigarettes a day for 1 year (for example: 1 pack a day for 30 years or 2 packs a day for 15 years). Yearly screening should continue until the smoker has stopped smoking for at least 15 years. Yearly screening should be stopped for people who develop a   health problem that would prevent them from having lung cancer treatment.   If you are pregnant, do not drink alcohol. If you are breastfeeding, be very cautious about drinking alcohol. If you are not pregnant and choose to drink alcohol, do not have more than 1 drink per day. One drink is considered to be 12 ounces (355 mL) of beer, 5 ounces (148 mL) of wine, or 1.5 ounces (44 mL) of liquor.   Avoid use of street drugs. Do not share needles with anyone. Ask for help if you need support or instructions about stopping the use of  drugs.   High blood pressure causes heart disease and increases the risk of stroke. Your blood pressure should be checked at least yearly.  Ongoing high blood pressure should be treated with medicines if weight loss and exercise do not work.   If you are 69-55 years old, ask your health care provider if you should take aspirin to prevent strokes.   Diabetes screening involves taking a blood sample to check your fasting blood sugar level. This should be done once every 3 years, after age 38, if you are within normal weight and without risk factors for diabetes. Testing should be considered at a younger age or be carried out more frequently if you are overweight and have at least 1 risk factor for diabetes.   Breast cancer screening is essential preventive care for women. You should practice "breast self-awareness."  This means understanding the normal appearance and feel of your breasts and may include breast self-examination.  Any changes detected, no matter how small, should be reported to a health care provider.  Women in their 80s and 30s should have a clinical breast exam (CBE) by a health care provider as part of a regular health exam every 1 to 3 years.  After age 66, women should have a CBE every year.  Starting at age 1, women should consider having a mammogram (breast X-ray test) every year.  Women who have a family history of breast cancer should talk to their health care provider about genetic screening.  Women at a high risk of breast cancer should talk to their health care providers about having an MRI and a mammogram every year.   -Breast cancer gene (BRCA)-related cancer risk assessment is recommended for women who have family members with BRCA-related cancers. BRCA-related cancers include breast, ovarian, tubal, and peritoneal cancers. Having family members with these cancers may be associated with an increased risk for harmful changes (mutations) in the breast cancer genes BRCA1 and  BRCA2. Results of the assessment will determine the need for genetic counseling and BRCA1 and BRCA2 testing.   The Pap test is a screening test for cervical cancer. A Pap test can show cell changes on the cervix that might become cervical cancer if left untreated. A Pap test is a procedure in which cells are obtained and examined from the lower end of the uterus (cervix).   - Women should have a Pap test starting at age 57.   - Between ages 90 and 70, Pap tests should be repeated every 2 years.   - Beginning at age 63, you should have a Pap test every 3 years as long as the past 3 Pap tests have been normal.   - Some women have medical problems that increase the chance of getting cervical cancer. Talk to your health care provider about these problems. It is especially important to talk to your health care provider if a  new problem develops soon after your last Pap test. In these cases, your health care provider may recommend more frequent screening and Pap tests.   - The above recommendations are the same for women who have or have not gotten the vaccine for human papillomavirus (HPV).   - If you had a hysterectomy for a problem that was not cancer or a condition that could lead to cancer, then you no longer need Pap tests. Even if you no longer need a Pap test, a regular exam is a good idea to make sure no other problems are starting.   - If you are between ages 36 and 66 years, and you have had normal Pap tests going back 10 years, you no longer need Pap tests. Even if you no longer need a Pap test, a regular exam is a good idea to make sure no other problems are starting.   - If you have had past treatment for cervical cancer or a condition that could lead to cancer, you need Pap tests and screening for cancer for at least 20 years after your treatment.   - If Pap tests have been discontinued, risk factors (such as a new sexual partner) need to be reassessed to determine if screening should  be resumed.   - The HPV test is an additional test that may be used for cervical cancer screening. The HPV test looks for the virus that can cause the cell changes on the cervix. The cells collected during the Pap test can be tested for HPV. The HPV test could be used to screen women aged 70 years and older, and should be used in women of any age who have unclear Pap test results. After the age of 67, women should have HPV testing at the same frequency as a Pap test.   Colorectal cancer can be detected and often prevented. Most routine colorectal cancer screening begins at the age of 57 years and continues through age 26 years. However, your health care provider may recommend screening at an earlier age if you have risk factors for colon cancer. On a yearly basis, your health care provider may provide home test kits to check for hidden blood in the stool.  Use of a small camera at the end of a tube, to directly examine the colon (sigmoidoscopy or colonoscopy), can detect the earliest forms of colorectal cancer. Talk to your health care provider about this at age 23, when routine screening begins. Direct exam of the colon should be repeated every 5 -10 years through age 49 years, unless early forms of pre-cancerous polyps or small growths are found.   People who are at an increased risk for hepatitis B should be screened for this virus. You are considered at high risk for hepatitis B if:  -You were born in a country where hepatitis B occurs often. Talk with your health care provider about which countries are considered high risk.  - Your parents were born in a high-risk country and you have not received a shot to protect against hepatitis B (hepatitis B vaccine).  - You have HIV or AIDS.  - You use needles to inject street drugs.  - You live with, or have sex with, someone who has Hepatitis B.  - You get hemodialysis treatment.  - You take certain medicines for conditions like cancer, organ  transplantation, and autoimmune conditions.   Hepatitis C blood testing is recommended for all people born from [birth year] through 1965 and any individual  with known risks for hepatitis C.   Practice safe sex. Use condoms and avoid high-risk sexual practices to reduce the spread of sexually transmitted infections (STIs). STIs include gonorrhea, chlamydia, syphilis, trichomonas, herpes, HPV, and human immunodeficiency virus (HIV). Herpes, HIV, and HPV are viral illnesses that have no cure. They can result in disability, cancer, and death. Sexually active women aged 25 years and younger should be checked for chlamydia. Older women with new or multiple partners should also be tested for chlamydia. Testing for other STIs is recommended if you are sexually active and at increased risk.   Osteoporosis is a disease in which the bones lose minerals and strength with aging. This can result in serious bone fractures or breaks. The risk of osteoporosis can be identified using a bone density scan. Women ages 65 years and over and women at risk for fractures or osteoporosis should discuss screening with their health care providers. Ask your health care provider whether you should take a calcium supplement or vitamin D to There are also several preventive steps women can take to avoid osteoporosis and resulting fractures or to keep osteoporosis from worsening. -->Recommendations include:  Eat a balanced diet high in fruits, vegetables, calcium, and vitamins.  Get enough calcium. The recommended total intake of is 1,200 mg daily; for best absorption, if taking supplements, divide doses into 250-500 mg doses throughout the day. Of the two types of calcium, calcium carbonate is best absorbed when taken with food but calcium citrate can be taken on an empty stomach.  Get enough vitamin D. NAMS and the National Osteoporosis Foundation recommend at least 1,000 IU per day for women age 50 and over who are at risk of vitamin D  deficiency. Vitamin D deficiency can be caused by inadequate sun exposure (for example, those who live in northern latitudes).  Avoid alcohol and smoking. Heavy alcohol intake (more than 7 drinks per week) increases the risk of falls and hip fracture and women smokers tend to lose bone more rapidly and have lower bone mass than nonsmokers. Stopping smoking is one of the most important changes women can make to improve their health and decrease risk for disease.  Be physically active every day. Weight-bearing exercise (for example, fast walking, hiking, jogging, and weight training) may strengthen bones or slow the rate of bone loss that comes with aging. Balancing and muscle-strengthening exercises can reduce the risk of falling and fracture.  Consider therapeutic medications. Currently, several types of effective drugs are available. Healthcare providers can recommend the type most appropriate for each woman.  Eliminate environmental factors that may contribute to accidents. Falls cause nearly 90% of all osteoporotic fractures, so reducing this risk is an important bone-health strategy. Measures include ample lighting, removing obstructions to walking, using nonskid rugs on floors, and placing mats and/or grab bars in showers.  Be aware of medication side effects. Some common medicines make bones weaker. These include a type of steroid drug called glucocorticoids used for arthritis and asthma, some antiseizure drugs, certain sleeping pills, treatments for endometriosis, and some cancer drugs. An overactive thyroid gland or using too much thyroid hormone for an underactive thyroid can also be a problem. If you are taking these medicines, talk to your doctor about what you can do to help protect your bones.reduce the rate of osteoporosis.    Menopause can be associated with physical symptoms and risks. Hormone replacement therapy is available to decrease symptoms and risks. You should talk to your  health care provider   about whether hormone replacement therapy is right for you.   Use sunscreen. Apply sunscreen liberally and repeatedly throughout the day. You should seek shade when your shadow is shorter than you. Protect yourself by wearing long sleeves, pants, a wide-brimmed hat, and sunglasses year round, whenever you are outdoors.   Once a month, do a whole body skin exam, using a mirror to look at the skin on your back. Tell your health care provider of new moles, moles that have irregular borders, moles that are larger than a pencil eraser, or moles that have changed in shape or color.   -Stay current with required vaccines (immunizations).   Influenza vaccine. All adults should be immunized every year.  Tetanus, diphtheria, and acellular pertussis (Td, Tdap) vaccine. Pregnant women should receive 1 dose of Tdap vaccine during each pregnancy. The dose should be obtained regardless of the length of time since the last dose. Immunization is preferred during the 27th 36th week of gestation. An adult who has not previously received Tdap or who does not know her vaccine status should receive 1 dose of Tdap. This initial dose should be followed by tetanus and diphtheria toxoids (Td) booster doses every 10 years. Adults with an unknown or incomplete history of completing a 3-dose immunization series with Td-containing vaccines should begin or complete a primary immunization series including a Tdap dose. Adults should receive a Td booster every 10 years.  Varicella vaccine. An adult without evidence of immunity to varicella should receive 2 doses or a second dose if she has previously received 1 dose. Pregnant females who do not have evidence of immunity should receive the first dose after pregnancy. This first dose should be obtained before leaving the health care facility. The second dose should be obtained 4 8 weeks after the first dose.  Human papillomavirus (HPV) vaccine. Females aged 13 26  years who have not received the vaccine previously should obtain the 3-dose series. The vaccine is not recommended for use in pregnant females. However, pregnancy testing is not needed before receiving a dose. If a female is found to be pregnant after receiving a dose, no treatment is needed. In that case, the remaining doses should be delayed until after the pregnancy. Immunization is recommended for any person with an immunocompromised condition through the age of 26 years if she did not get any or all doses earlier. During the 3-dose series, the second dose should be obtained 4 8 weeks after the first dose. The third dose should be obtained 24 weeks after the first dose and 16 weeks after the second dose.  Zoster vaccine. One dose is recommended for adults aged 60 years or older unless certain conditions are present.  Measles, mumps, and rubella (MMR) vaccine. Adults born before 1957 generally are considered immune to measles and mumps. Adults born in 1957 or later should have 1 or more doses of MMR vaccine unless there is a contraindication to the vaccine or there is laboratory evidence of immunity to each of the three diseases. A routine second dose of MMR vaccine should be obtained at least 28 days after the first dose for students attending postsecondary schools, health care workers, or international travelers. People who received inactivated measles vaccine or an unknown type of measles vaccine during 1963 1967 should receive 2 doses of MMR vaccine. People who received inactivated mumps vaccine or an unknown type of mumps vaccine before 1979 and are at high risk for mumps infection should consider immunization with 2 doses of   MMR vaccine. For females of childbearing age, rubella immunity should be determined. If there is no evidence of immunity, females who are not pregnant should be vaccinated. If there is no evidence of immunity, females who are pregnant should delay immunization until after pregnancy.  Unvaccinated health care workers born before [birth year] who lack laboratory evidence of measles, mumps, or rubella immunity or laboratory confirmation of disease should consider measles and mumps immunization with 2 doses of MMR vaccine or rubella immunization with 1 dose of MMR vaccine.  Pneumococcal 13-valent conjugate (PCV13) vaccine. When indicated, a person who is uncertain of her immunization history and has no record of immunization should receive the PCV13 vaccine. An adult aged 54 years or older who has certain medical conditions and has not been previously immunized should receive 1 dose of PCV13 vaccine. This PCV13 should be followed with a dose of pneumococcal polysaccharide (PPSV23) vaccine. The PPSV23 vaccine dose should be obtained at least 8 weeks after the dose of PCV13 vaccine. An adult aged 58 years or older who has certain medical conditions and previously received 1 or more doses of PPSV23 vaccine should receive 1 dose of PCV13. The PCV13 vaccine dose should be obtained 1 or more years after the last PPSV23 vaccine dose.  Pneumococcal polysaccharide (PPSV23) vaccine. When PCV13 is also indicated, PCV13 should be obtained first. All adults aged 58 years and older should be immunized. An adult younger than age 65 years who has certain medical conditions should be immunized. Any person who resides in a nursing home or long-term care facility should be immunized. An adult smoker should be immunized. People with an immunocompromised condition and certain other conditions should receive both PCV13 and PPSV23 vaccines. People with human immunodeficiency virus (HIV) infection should be immunized as soon as possible after diagnosis. Immunization during chemotherapy or radiation therapy should be avoided. Routine use of PPSV23 vaccine is not recommended for American Indians, [place] Natives, or people younger than 65 years unless there are medical conditions that require PPSV23 vaccine. When indicated,  people who have unknown immunization and have no record of immunization should receive PPSV23 vaccine. One-time revaccination 5 years after the first dose of PPSV23 is recommended for people aged 70 64 years who have chronic kidney failure, nephrotic syndrome, asplenia, or immunocompromised conditions. People who received 1 2 doses of PPSV23 before age 32 years should receive another dose of PPSV23 vaccine at age 96 years or later if at least 5 years have passed since the previous dose. Doses of PPSV23 are not needed for people immunized with PPSV23 at or after age 55 years.  Meningococcal vaccine. Adults with asplenia or persistent complement component deficiencies should receive 2 doses of quadrivalent meningococcal conjugate (MenACWY-D) vaccine. The doses should be obtained at least 2 months apart. Microbiologists working with certain meningococcal bacteria, [employer] recruits, people at risk during an outbreak, and people who travel to or live in countries with a high rate of meningitis should be immunized. A first-year college student up through age 58 years who is living in a residence hall should receive a dose if she did not receive a dose on or after her 16th birthday. Adults who have certain high-risk conditions should receive one or more doses of vaccine.  Hepatitis A vaccine. Adults who wish to be protected from this disease, have certain high-risk conditions, work with hepatitis A-infected animals, work in hepatitis A research labs, or travel to or work in countries with a high rate of hepatitis A should be  immunized. Adults who were previously unvaccinated and who anticipate close contact with an international adoptee during the first 60 days after arrival in the [place] from a country with a high rate of hepatitis A should be immunized.  Hepatitis B vaccine.  Adults who wish to be protected from this disease, have certain high-risk conditions, may be exposed to blood or other infectious  body fluids, are household contacts or sex partners of hepatitis B positive people, are clients or workers in certain care facilities, or travel to or work in countries with a high rate of hepatitis B should be immunized.  Haemophilus influenzae type b (Hib) vaccine. A previously unvaccinated person with asplenia or sickle cell disease or having a scheduled splenectomy should receive 1 dose of Hib vaccine. Regardless of previous immunization, a recipient of a hematopoietic stem cell transplant should receive a 3-dose series 6 12 months after her successful transplant. Hib vaccine is not recommended for adults with HIV infection.  Preventive Services / Frequency Ages 6 to 39years  Blood pressure check.** / Every 1 to 2 years.  Lipid and cholesterol check.** / Every 5 years beginning at age 39.  Clinical breast exam.** / Every 3 years for women in their 61s and 62s.  BRCA-related cancer risk assessment.** / For women who have family members with a BRCA-related cancer (breast, ovarian, tubal, or peritoneal cancers).  Pap test.** / Every 2 years from ages 47 through 85. Every 3 years starting at age 34 through age 12 or 74 with a history of 3 consecutive normal Pap tests.  HPV screening.** / Every 3 years from ages 46 through ages 43 to 54 with a history of 3 consecutive normal Pap tests.  Hepatitis C blood test.** / For any individual with known risks for hepatitis C.  Skin self-exam. / Monthly.  Influenza vaccine. / Every year.  Tetanus, diphtheria, and acellular pertussis (Tdap, Td) vaccine.** / Consult your health care provider. Pregnant women should receive 1 dose of Tdap vaccine during each pregnancy. 1 dose of Td every 10 years.  Varicella vaccine.** / Consult your health care provider. Pregnant females who do not have evidence of immunity should receive the first dose after pregnancy.  HPV vaccine. / 3 doses over 6 months, if 64 and younger. The vaccine is not recommended for use in  pregnant females. However, pregnancy testing is not needed before receiving a dose.  Measles, mumps, rubella (MMR) vaccine.** / You need at least 1 dose of MMR if you were born in 1957 or later. You may also need a 2nd dose. For females of childbearing age, rubella immunity should be determined. If there is no evidence of immunity, females who are not pregnant should be vaccinated. If there is no evidence of immunity, females who are pregnant should delay immunization until after pregnancy.  Pneumococcal 13-valent conjugate (PCV13) vaccine.** / Consult your health care provider.  Pneumococcal polysaccharide (PPSV23) vaccine.** / 1 to 2 doses if you smoke cigarettes or if you have certain conditions.  Meningococcal vaccine.** / 1 dose if you are age 71 to 37 years and a Market researcher living in a residence hall, or have one of several medical conditions, you need to get vaccinated against meningococcal disease. You may also need additional booster doses.  Hepatitis A vaccine.** / Consult your health care provider.  Hepatitis B vaccine.** / Consult your health care provider.  Haemophilus influenzae type b (Hib) vaccine.** / Consult your health care provider.  Ages 55 to 64years  Blood pressure check.** / Every 1 to 2 years.  Lipid and cholesterol check.** / Every 5 years beginning at age 20 years.  Lung cancer screening. / Every year if you are aged 55 80 years and have a 30-pack-year history of smoking and currently smoke or have quit within the past 15 years. Yearly screening is stopped once you have quit smoking for at least 15 years or develop a health problem that would prevent you from having lung cancer treatment.  Clinical breast exam.** / Every year after age 40 years.  BRCA-related cancer risk assessment.** / For women who have family members with a BRCA-related cancer (breast, ovarian, tubal, or peritoneal cancers).  Mammogram.** / Every year beginning at age 40  years and continuing for as long as you are in good health. Consult with your health care provider.  Pap test.** / Every 3 years starting at age 30 years through age 65 or 70 years with a history of 3 consecutive normal Pap tests.  HPV screening.** / Every 3 years from ages 30 years through ages 65 to 70 years with a history of 3 consecutive normal Pap tests.  Fecal occult blood test (FOBT) of stool. / Every year beginning at age 50 years and continuing until age 75 years. You may not need to do this test if you get a colonoscopy every 10 years.  Flexible sigmoidoscopy or colonoscopy.** / Every 5 years for a flexible sigmoidoscopy or every 10 years for a colonoscopy beginning at age 50 years and continuing until age 75 years.  Hepatitis C blood test.** / For all people born from 1945 through 1965 and any individual with known risks for hepatitis C.  Skin self-exam. / Monthly.  Influenza vaccine. / Every year.  Tetanus, diphtheria, and acellular pertussis (Tdap/Td) vaccine.** / Consult your health care provider. Pregnant women should receive 1 dose of Tdap vaccine during each pregnancy. 1 dose of Td every 10 years.  Varicella vaccine.** / Consult your health care provider. Pregnant females who do not have evidence of immunity should receive the first dose after pregnancy.  Zoster vaccine.** / 1 dose for adults aged 60 years or older.  Measles, mumps, rubella (MMR) vaccine.** / You need at least 1 dose of MMR if you were born in 1957 or later. You may also need a 2nd dose. For females of childbearing age, rubella immunity should be determined. If there is no evidence of immunity, females who are not pregnant should be vaccinated. If there is no evidence of immunity, females who are pregnant should delay immunization until after pregnancy.  Pneumococcal 13-valent conjugate (PCV13) vaccine.** / Consult your health care provider.  Pneumococcal polysaccharide (PPSV23) vaccine.** / 1 to 2 doses if  you smoke cigarettes or if you have certain conditions.  Meningococcal vaccine.** / Consult your health care provider.  Hepatitis A vaccine.** / Consult your health care provider.  Hepatitis B vaccine.** / Consult your health care provider.  Haemophilus influenzae type b (Hib) vaccine.** / Consult your health care provider.  Ages 65 years and over  Blood pressure check.** / Every 1 to 2 years.  Lipid and cholesterol check.** / Every 5 years beginning at age 20 years.  Lung cancer screening. / Every year if you are aged 55 80 years and have a 30-pack-year history of smoking and currently smoke or have quit within the past 15 years. Yearly screening is stopped once you have quit smoking for at least 15 years or develop a health problem that   would prevent you from having lung cancer treatment.  Clinical breast exam.** / Every year after age 103 years.  BRCA-related cancer risk assessment.** / For women who have family members with a BRCA-related cancer (breast, ovarian, tubal, or peritoneal cancers).  Mammogram.** / Every year beginning at age 36 years and continuing for as long as you are in good health. Consult with your health care provider.  Pap test.** / Every 3 years starting at age 5 years through age 85 or 10 years with 3 consecutive normal Pap tests. Testing can be stopped between 65 and 70 years with 3 consecutive normal Pap tests and no abnormal Pap or HPV tests in the past 10 years.  HPV screening.** / Every 3 years from ages 93 years through ages 70 or 45 years with a history of 3 consecutive normal Pap tests. Testing can be stopped between 65 and 70 years with 3 consecutive normal Pap tests and no abnormal Pap or HPV tests in the past 10 years.  Fecal occult blood test (FOBT) of stool. / Every year beginning at age 8 years and continuing until age 45 years. You may not need to do this test if you get a colonoscopy every 10 years.  Flexible sigmoidoscopy or colonoscopy.** /  Every 5 years for a flexible sigmoidoscopy or every 10 years for a colonoscopy beginning at age 69 years and continuing until age 68 years.  Hepatitis C blood test.** / For all people born from [birth year] through 1965 and any individual with known risks for hepatitis C.  Osteoporosis screening.** / A one-time screening for women ages 7 years and over and women at risk for fractures or osteoporosis.  Skin self-exam. / Monthly.  Influenza vaccine. / Every year.  Tetanus, diphtheria, and acellular pertussis (Tdap/Td) vaccine.** / 1 dose of Td every 10 years.  Varicella vaccine.** / Consult your health care provider.  Zoster vaccine.** / 1 dose for adults aged 5 years or older.  Pneumococcal 13-valent conjugate (PCV13) vaccine.** / Consult your health care provider.  Pneumococcal polysaccharide (PPSV23) vaccine.** / 1 dose for all adults aged 74 years and older.  Meningococcal vaccine.** / Consult your health care provider.  Hepatitis A vaccine.** / Consult your health care provider.  Hepatitis B vaccine.** / Consult your health care provider.  Haemophilus influenzae type b (Hib) vaccine.** / Consult your health care provider. ** Family history and personal history of risk and conditions may change your health care provider's recommendations. Document Released: 10/28/2001 Document Revised: 06/22/2013  [facility] Patient Information 2014 [place], [place].   EXERCISE AND DIET:  We recommended that you start or continue a regular exercise program for good health. Regular exercise means any activity that makes your heart beat faster and makes you sweat.  We recommend exercising at least 30 minutes per day at least 3 days a week, preferably 5.  We also recommend a diet low in fat and sugar / carbohydrates.  Inactivity, poor dietary choices and obesity can cause diabetes, heart attack, stroke, and kidney damage, among others.     ALCOHOL AND SMOKING:  Women should limit their alcohol intake to no  more than 7 drinks/beers/glasses of wine (combined, not each!) per week. Moderation of alcohol intake to this level decreases your risk of breast cancer and liver damage.  ( And of course, no recreational drugs are part of a healthy lifestyle.)  Also, you should not be smoking at all or even being exposed to second hand smoke. Most people know smoking can  cause cancer, and various heart and lung diseases, but did you know it also contributes to weakening of your bones?  Aging of your skin?  Yellowing of your teeth and nails?   CALCIUM AND VITAMIN D:  Adequate intake of calcium and Vitamin D are recommended.  The recommendations for exact amounts of these supplements seem to change often, but generally speaking 600 mg of calcium (either carbonate or citrate) and 800 units of Vitamin D per day seems prudent. Certain women may benefit from higher intake of Vitamin D.  If you are among these women, your doctor will have told you during your visit.     PAP SMEARS:  Pap smears, to check for cervical cancer or precancers,  have traditionally been done yearly, although recent scientific advances have shown that most women can have pap smears less often.  However, every woman still should have a physical exam from her gynecologist or primary care physician every year. It will include a breast check, inspection of the vulva and vagina to check for abnormal growths or skin changes, a visual exam of the cervix, and then an exam to evaluate the size and shape of the uterus and ovaries.  And after 33 years of age, a rectal exam is indicated to check for rectal cancers. We will also provide age appropriate advice regarding health maintenance, like when you should have certain vaccines, screening for sexually transmitted diseases, bone density testing, colonoscopy, mammograms, etc.    MAMMOGRAMS:  All women over 53 years old should have a yearly mammogram. Many facilities now offer a "3D" mammogram, which may cost  around $50 extra out of pocket. If possible,  we recommend you accept the option to have the 3D mammogram performed.  It both reduces the number of women who will be called back for extra views which then turn out to be normal, and it is better than the routine mammogram at detecting truly abnormal areas.     COLONOSCOPY:  Colonoscopy to screen for colon cancer is recommended for all women at age 73.  We know, you hate the idea of the prep.  We agree, BUT, having colon cancer and not knowing it is worse!!  Colon cancer so often starts as a polyp that can be seen and removed at colonscopy, which can quite literally save your life!  And if your first colonoscopy is normal and you have no family history of colon cancer, most women don't have to have it again for 10 years.  Once every ten years, you can do something that may end up saving your life, right?  We will be happy to help you get it scheduled when you are ready.  Be sure to check your insurance coverage so you understand how much it will cost.  It may be covered as a preventative service at no cost, but you should check your particular policy.    Overall your labs, blood pressure, and weight are all stable. Increase water intake, strive for at least 75 ounces/day.   Follow Heart Healthy diet Increase regular exercise.  Recommend at least 30 minutes daily, 5 days per week of walking, jogging, biking, swimming, YouTube/Pinterest workout videos. Recommend annual physical, fasting labs the week prior. Continue to social distance and wear a mask when in public. GREAT TO SEE YOU!

## 2019-06-13 DIAGNOSIS — M9903 Segmental and somatic dysfunction of lumbar region: Secondary | ICD-10-CM | POA: Diagnosis not present

## 2019-06-13 DIAGNOSIS — R51 Headache: Secondary | ICD-10-CM | POA: Diagnosis not present

## 2019-06-13 DIAGNOSIS — M9902 Segmental and somatic dysfunction of thoracic region: Secondary | ICD-10-CM | POA: Diagnosis not present

## 2019-06-13 DIAGNOSIS — M9901 Segmental and somatic dysfunction of cervical region: Secondary | ICD-10-CM | POA: Diagnosis not present

## 2019-06-13 DIAGNOSIS — M5431 Sciatica, right side: Secondary | ICD-10-CM | POA: Diagnosis not present

## 2019-06-20 MED FILL — BLISOVI FE 1/20 1-20 MG-MCG: 1-20 | 84 days supply | Qty: 84 | Fill #1

## 2019-07-07 ENCOUNTER — Other Ambulatory Visit: Payer: Self-pay

## 2019-07-07 DIAGNOSIS — Z20822 Contact with and (suspected) exposure to covid-19: Secondary | ICD-10-CM

## 2019-07-07 DIAGNOSIS — Z20828 Contact with and (suspected) exposure to other viral communicable diseases: Secondary | ICD-10-CM | POA: Diagnosis not present

## 2019-07-10 LAB — NOVEL CORONAVIRUS, NAA: SARS-CoV-2, NAA: NOT DETECTED

## 2019-07-12 DIAGNOSIS — R519 Headache, unspecified: Secondary | ICD-10-CM | POA: Diagnosis not present

## 2019-07-12 DIAGNOSIS — M5431 Sciatica, right side: Secondary | ICD-10-CM | POA: Diagnosis not present

## 2019-07-12 DIAGNOSIS — M9902 Segmental and somatic dysfunction of thoracic region: Secondary | ICD-10-CM | POA: Diagnosis not present

## 2019-07-12 DIAGNOSIS — M9903 Segmental and somatic dysfunction of lumbar region: Secondary | ICD-10-CM | POA: Diagnosis not present

## 2019-07-12 DIAGNOSIS — M9901 Segmental and somatic dysfunction of cervical region: Secondary | ICD-10-CM | POA: Diagnosis not present

## 2019-07-13 MED FILL — LEVOTHYROXINE 25 MCG TABLET: 25 | 90 days supply | Qty: 90 | Fill #1

## 2019-08-08 DIAGNOSIS — Z6828 Body mass index (BMI) 28.0-28.9, adult: Secondary | ICD-10-CM | POA: Diagnosis not present

## 2019-08-08 DIAGNOSIS — Z01419 Encounter for gynecological examination (general) (routine) without abnormal findings: Secondary | ICD-10-CM | POA: Diagnosis not present

## 2019-08-09 DIAGNOSIS — M9901 Segmental and somatic dysfunction of cervical region: Secondary | ICD-10-CM | POA: Diagnosis not present

## 2019-08-09 DIAGNOSIS — M9903 Segmental and somatic dysfunction of lumbar region: Secondary | ICD-10-CM | POA: Diagnosis not present

## 2019-08-09 DIAGNOSIS — R519 Headache, unspecified: Secondary | ICD-10-CM | POA: Diagnosis not present

## 2019-08-09 DIAGNOSIS — M9902 Segmental and somatic dysfunction of thoracic region: Secondary | ICD-10-CM | POA: Diagnosis not present

## 2019-08-09 DIAGNOSIS — M5431 Sciatica, right side: Secondary | ICD-10-CM | POA: Diagnosis not present

## 2019-08-22 DIAGNOSIS — D225 Melanocytic nevi of trunk: Secondary | ICD-10-CM | POA: Diagnosis not present

## 2019-08-22 DIAGNOSIS — L814 Other melanin hyperpigmentation: Secondary | ICD-10-CM | POA: Diagnosis not present

## 2019-08-22 DIAGNOSIS — D1801 Hemangioma of skin and subcutaneous tissue: Secondary | ICD-10-CM | POA: Diagnosis not present

## 2019-08-22 DIAGNOSIS — Z872 Personal history of diseases of the skin and subcutaneous tissue: Secondary | ICD-10-CM | POA: Diagnosis not present

## 2019-09-06 DIAGNOSIS — R519 Headache, unspecified: Secondary | ICD-10-CM | POA: Diagnosis not present

## 2019-09-06 DIAGNOSIS — M9903 Segmental and somatic dysfunction of lumbar region: Secondary | ICD-10-CM | POA: Diagnosis not present

## 2019-09-06 DIAGNOSIS — M5431 Sciatica, right side: Secondary | ICD-10-CM | POA: Diagnosis not present

## 2019-09-06 DIAGNOSIS — M9901 Segmental and somatic dysfunction of cervical region: Secondary | ICD-10-CM | POA: Diagnosis not present

## 2019-09-06 DIAGNOSIS — M9902 Segmental and somatic dysfunction of thoracic region: Secondary | ICD-10-CM | POA: Diagnosis not present

## 2019-10-10 DIAGNOSIS — M9902 Segmental and somatic dysfunction of thoracic region: Secondary | ICD-10-CM | POA: Diagnosis not present

## 2019-10-10 DIAGNOSIS — R519 Headache, unspecified: Secondary | ICD-10-CM | POA: Diagnosis not present

## 2019-10-10 DIAGNOSIS — M9901 Segmental and somatic dysfunction of cervical region: Secondary | ICD-10-CM | POA: Diagnosis not present

## 2019-10-10 DIAGNOSIS — M5431 Sciatica, right side: Secondary | ICD-10-CM | POA: Diagnosis not present

## 2019-10-10 DIAGNOSIS — M9903 Segmental and somatic dysfunction of lumbar region: Secondary | ICD-10-CM | POA: Diagnosis not present

## 2019-10-18 ENCOUNTER — Other Ambulatory Visit: Payer: Self-pay

## 2019-10-18 MED ORDER — LEVOTHYROXINE SODIUM 25 MCG PO TABS: ORAL_TABLET | ORAL | 0 refills | Status: DC

## 2019-11-08 DIAGNOSIS — M9903 Segmental and somatic dysfunction of lumbar region: Secondary | ICD-10-CM | POA: Diagnosis not present

## 2019-11-08 DIAGNOSIS — M5431 Sciatica, right side: Secondary | ICD-10-CM | POA: Diagnosis not present

## 2019-11-08 DIAGNOSIS — M9901 Segmental and somatic dysfunction of cervical region: Secondary | ICD-10-CM | POA: Diagnosis not present

## 2019-11-08 DIAGNOSIS — M9902 Segmental and somatic dysfunction of thoracic region: Secondary | ICD-10-CM | POA: Diagnosis not present

## 2019-11-08 DIAGNOSIS — R519 Headache, unspecified: Secondary | ICD-10-CM | POA: Diagnosis not present

## 2019-12-06 DIAGNOSIS — M9903 Segmental and somatic dysfunction of lumbar region: Secondary | ICD-10-CM | POA: Diagnosis not present

## 2019-12-06 DIAGNOSIS — M9901 Segmental and somatic dysfunction of cervical region: Secondary | ICD-10-CM | POA: Diagnosis not present

## 2019-12-06 DIAGNOSIS — R519 Headache, unspecified: Secondary | ICD-10-CM | POA: Diagnosis not present

## 2019-12-06 DIAGNOSIS — M5431 Sciatica, right side: Secondary | ICD-10-CM | POA: Diagnosis not present

## 2019-12-06 DIAGNOSIS — M9902 Segmental and somatic dysfunction of thoracic region: Secondary | ICD-10-CM | POA: Diagnosis not present

## 2020-01-03 DIAGNOSIS — M9902 Segmental and somatic dysfunction of thoracic region: Secondary | ICD-10-CM | POA: Diagnosis not present

## 2020-01-03 DIAGNOSIS — M9901 Segmental and somatic dysfunction of cervical region: Secondary | ICD-10-CM | POA: Diagnosis not present

## 2020-01-03 DIAGNOSIS — M9903 Segmental and somatic dysfunction of lumbar region: Secondary | ICD-10-CM | POA: Diagnosis not present

## 2020-01-03 DIAGNOSIS — M5431 Sciatica, right side: Secondary | ICD-10-CM | POA: Diagnosis not present

## 2020-01-03 DIAGNOSIS — R519 Headache, unspecified: Secondary | ICD-10-CM | POA: Diagnosis not present

## 2020-01-09 ENCOUNTER — Other Ambulatory Visit (HOSPITAL_COMMUNITY): Payer: Self-pay | Admitting: Obstetrics and Gynecology

## 2020-01-09 ENCOUNTER — Other Ambulatory Visit: Payer: Self-pay | Admitting: Adult Health

## 2020-01-30 ENCOUNTER — Telehealth: Payer: 59 | Admitting: Nurse Practitioner

## 2020-01-30 DIAGNOSIS — J0100 Acute maxillary sinusitis, unspecified: Secondary | ICD-10-CM | POA: Diagnosis not present

## 2020-01-30 MED ORDER — AMOXICILLIN-POT CLAVULANATE 875-125 MG PO TABS: 1.0000 | ORAL_TABLET | Freq: Two times a day (BID) | ORAL | 0 refills | Status: DC

## 2020-01-30 NOTE — Progress Notes (Signed)

## 2020-02-06 DIAGNOSIS — M5431 Sciatica, right side: Secondary | ICD-10-CM | POA: Diagnosis not present

## 2020-02-06 DIAGNOSIS — M9902 Segmental and somatic dysfunction of thoracic region: Secondary | ICD-10-CM | POA: Diagnosis not present

## 2020-02-06 DIAGNOSIS — M9903 Segmental and somatic dysfunction of lumbar region: Secondary | ICD-10-CM | POA: Diagnosis not present

## 2020-02-06 DIAGNOSIS — M9901 Segmental and somatic dysfunction of cervical region: Secondary | ICD-10-CM | POA: Diagnosis not present

## 2020-02-06 DIAGNOSIS — R519 Headache, unspecified: Secondary | ICD-10-CM | POA: Diagnosis not present

## 2020-03-13 DIAGNOSIS — M9903 Segmental and somatic dysfunction of lumbar region: Secondary | ICD-10-CM | POA: Diagnosis not present

## 2020-03-13 DIAGNOSIS — M5431 Sciatica, right side: Secondary | ICD-10-CM | POA: Diagnosis not present

## 2020-03-13 DIAGNOSIS — R519 Headache, unspecified: Secondary | ICD-10-CM | POA: Diagnosis not present

## 2020-03-13 DIAGNOSIS — M9901 Segmental and somatic dysfunction of cervical region: Secondary | ICD-10-CM | POA: Diagnosis not present

## 2020-03-13 DIAGNOSIS — M9902 Segmental and somatic dysfunction of thoracic region: Secondary | ICD-10-CM | POA: Diagnosis not present

## 2020-04-10 DIAGNOSIS — M9902 Segmental and somatic dysfunction of thoracic region: Secondary | ICD-10-CM | POA: Diagnosis not present

## 2020-04-10 DIAGNOSIS — M9903 Segmental and somatic dysfunction of lumbar region: Secondary | ICD-10-CM | POA: Diagnosis not present

## 2020-04-10 DIAGNOSIS — R519 Headache, unspecified: Secondary | ICD-10-CM | POA: Diagnosis not present

## 2020-04-10 DIAGNOSIS — M5431 Sciatica, right side: Secondary | ICD-10-CM | POA: Diagnosis not present

## 2020-04-10 DIAGNOSIS — M9901 Segmental and somatic dysfunction of cervical region: Secondary | ICD-10-CM | POA: Diagnosis not present

## 2020-04-17 DIAGNOSIS — H5212 Myopia, left eye: Secondary | ICD-10-CM | POA: Diagnosis not present

## 2020-04-19 ENCOUNTER — Other Ambulatory Visit: Payer: Self-pay

## 2020-04-19 ENCOUNTER — Other Ambulatory Visit: Payer: 59

## 2020-04-19 DIAGNOSIS — Z20822 Contact with and (suspected) exposure to covid-19: Secondary | ICD-10-CM | POA: Diagnosis not present

## 2020-04-20 LAB — NOVEL CORONAVIRUS, NAA: SARS-CoV-2, NAA: NOT DETECTED

## 2020-04-20 LAB — SARS-COV-2, NAA 2 DAY TAT

## 2020-05-01 ENCOUNTER — Other Ambulatory Visit: Payer: Self-pay | Admitting: Physician Assistant

## 2020-05-01 DIAGNOSIS — Z0000 Encounter for general adult medical examination without abnormal findings: Secondary | ICD-10-CM

## 2020-05-02 ENCOUNTER — Other Ambulatory Visit: Payer: 59

## 2020-05-02 ENCOUNTER — Other Ambulatory Visit: Payer: Self-pay

## 2020-05-02 DIAGNOSIS — Z0000 Encounter for general adult medical examination without abnormal findings: Secondary | ICD-10-CM | POA: Diagnosis not present

## 2020-05-03 ENCOUNTER — Encounter: Payer: Self-pay | Admitting: Physician Assistant

## 2020-05-03 DIAGNOSIS — Z20828 Contact with and (suspected) exposure to other viral communicable diseases: Secondary | ICD-10-CM | POA: Diagnosis not present

## 2020-05-03 LAB — HEMOGLOBIN A1C
Est. average glucose Bld gHb Est-mCnc: 103 mg/dL
Hgb A1c MFr Bld: 5.2 % (ref 4.8–5.6)

## 2020-05-03 LAB — LIPID PANEL
Chol/HDL Ratio: 4.1 ratio (ref 0.0–4.4)
Cholesterol, Total: 187 mg/dL (ref 100–199)
HDL: 46 mg/dL (ref >39)
LDL Chol Calc (NIH): 118 mg/dL — ABNORMAL HIGH (ref 0–99)
Triglycerides: 126 mg/dL (ref 0–149)
VLDL Cholesterol Cal: 23 mg/dL (ref 5–40)

## 2020-05-03 LAB — COMPREHENSIVE METABOLIC PANEL
ALT: 13 IU/L (ref 0–32)
AST: 17 IU/L (ref 0–40)
Albumin/Globulin Ratio: 2 (ref 1.2–2.2)
Albumin: 4.3 g/dL (ref 3.8–4.8)
Alkaline Phosphatase: 47 IU/L — ABNORMAL LOW (ref 48–121)
BUN/Creatinine Ratio: 13 (ref 9–23)
BUN: 10 mg/dL (ref 6–20)
Bilirubin Total: 0.4 mg/dL (ref 0.0–1.2)
CO2: 23 mmol/L (ref 20–29)
Calcium: 9.3 mg/dL (ref 8.7–10.2)
Chloride: 103 mmol/L (ref 96–106)
Creatinine, Ser: 0.75 mg/dL (ref 0.57–1.00)
GFR calc Af Amer: 120 mL/min/{1.73_m2} (ref >59)
GFR calc non Af Amer: 104 mL/min/{1.73_m2} (ref >59)
Globulin, Total: 2.2 g/dL (ref 1.5–4.5)
Glucose: 92 mg/dL (ref 65–99)
Potassium: 4.6 mmol/L (ref 3.5–5.2)
Sodium: 138 mmol/L (ref 134–144)
Total Protein: 6.5 g/dL (ref 6.0–8.5)

## 2020-05-03 LAB — CBC
Hematocrit: 40.5 % (ref 34.0–46.6)
Hemoglobin: 13.5 g/dL (ref 11.1–15.9)
MCH: 30.5 pg (ref 26.6–33.0)
MCHC: 33.3 g/dL (ref 31.5–35.7)
MCV: 91 fL (ref 79–97)
Platelets: 313 10*3/uL (ref 150–450)
RBC: 4.43 x10E6/uL (ref 3.77–5.28)
RDW: 12 % (ref 11.7–15.4)
WBC: 4.2 10*3/uL (ref 3.4–10.8)

## 2020-05-03 LAB — TSH: TSH: 1.86 u[IU]/mL (ref 0.450–4.500)

## 2020-05-09 ENCOUNTER — Ambulatory Visit (INDEPENDENT_AMBULATORY_CARE_PROVIDER_SITE_OTHER): Payer: 59 | Admitting: Physician Assistant

## 2020-05-09 ENCOUNTER — Other Ambulatory Visit: Payer: Self-pay

## 2020-05-09 VITALS — BP 121/75 | HR 89 | Ht 67.0 in | Wt 187.5 lb

## 2020-05-09 DIAGNOSIS — E039 Hypothyroidism, unspecified: Secondary | ICD-10-CM

## 2020-05-09 DIAGNOSIS — E049 Nontoxic goiter, unspecified: Secondary | ICD-10-CM | POA: Diagnosis not present

## 2020-05-09 DIAGNOSIS — Z0000 Encounter for general adult medical examination without abnormal findings: Secondary | ICD-10-CM | POA: Diagnosis not present

## 2020-05-09 DIAGNOSIS — E7800 Pure hypercholesterolemia, unspecified: Secondary | ICD-10-CM

## 2020-05-09 NOTE — Progress Notes (Signed)
Female Physical   Impression and Recommendations:    No diagnosis found.   1) Anticipatory Guidance: Discussed skin CA prevention and sunscreen when outside along with skin surveillance; eating a balanced and modest diet; physical activity at least 25 minutes per day or minimum of 150 min/ week moderate to intense activity.  2) Immunizations / Screenings / Labs:   All immunizations are up-to-date per recommendations or will be updated today if pt allows.    - Patient understands with dental and vision screens they will schedule independently.  - Obtained CBC, CMP, HgA1c, Lipid panel, and TSH when fasting.  Most labs are essentially within normal limits. Bad cholesterol mildly elevated, patient reports recently was not as diligent with diet due to being on vacation. -UTD on Tdap -Patient declined hep C and HIV screenings  3) Weight:  BMI meaning discussed with patient.  Discussed goal to improve diet habits to improve overall feelings of well being and objective health data. Improve nutrient density of diet through increasing intake of fruits and vegetables and decreasing saturated fats, white flour products and refined sugars.  4) Healthcare maintenance: -Continue current medication regimen. -Continue physical activity regimen and follow heart healthy diet. -Placed thyroid ultrasound to monitor goiter. -Discussed with patient repeating lipid panel in 8 weeks.  Patient is concerned due to family history of CABG and sudden death (father). Also discussed statin therapy once weekly if LDL fails to improve due to family history. Pt verbalized understanding. -Return in 6 months for regular office visit: Hypothyroid, elevated LDL   No orders of the defined types were placed in this encounter.   No orders of the defined types were placed in this encounter.    No follow-ups on file.    Gross side effects, risk and benefits, and alternatives of medications discussed with patient.   Patient is aware that all medications have potential side effects and we are unable to predict every side effect or drug-drug interaction that may occur.  Expresses verbal understanding and consents to current therapy plan and treatment regimen.  F-up preventative CPE in 1 year- this is in addition to any chronic care visits.    Please see orders placed and AVS handed out to patient at the end of our visit for further patient instructions/ counseling done pertaining to today's office visit.  Note:  This note was prepared with assistance of Dragon voice recognition software. Occasional wrong-word or sound-a-like substitutions may have occurred due to the inherent limitations of voice recognition software.      Subjective:     CPE HPI: Jacqueline Chen is a 34 y.o. female who presents to [place] [facility] today a yearly health maintenance exam.   Health Maintenance Summary  - Reviewed and updated, unless pt declines services.  Tobacco History Reviewed:  Y, never smoker Alcohol and/or drug use:    No concerns; no use Exercise Habits: Moderate physical activity Dental Home: Yes Eye exams: Yes Dermatology home: Yes  Female Health:  PAP Smear - last known results: Followed by OB/GYN-Dr. Corinna Capra, requesting most recent Pap STD concerns:   none Birth control method: OCP Lumps or breast concerns:    none Breast Cancer Family History: No     Immunization History  Administered Date(s) Administered   Influenza-Unspecified 06/01/2019   Pneumococcal Polysaccharide-23 12/02/2010   Tdap 01/19/2014   Zoster Recombinat (Shingrix) 12/02/2010     Health Maintenance  Topic Date Due   Hepatitis C Screening  Never done  PAP SMEAR-Modifier  11/22/2019   INFLUENZA VACCINE  04/15/2020   TETANUS/TDAP  01/20/2024   HIV Screening  Completed     Wt Readings from Last 3 Encounters:  05/09/20 187 lb 8 oz (85 kg)  05/10/19 176 lb 4.8 oz (80 kg)  01/15/18 166 lb  4.8 oz (75.4 kg)   BP Readings from Last 3 Encounters:  05/09/20 121/75  05/10/19 93/65  01/15/18 96/65   Pulse Readings from Last 3 Encounters:  05/09/20 89  05/10/19 70  01/15/18 93     Past Medical History:  Diagnosis Date   Goiter 2015   multinodular goiter   Thyroid disease    [employer]reesia 05/09/2020      History reviewed. No pertinent surgical history.    Family History  Problem Relation Age of Onset   Healthy Mother    Heart attack Father    Hyperlipidemia Father    Hypertension Father    Healthy Sister    Healthy Brother    Healthy Daughter    Cancer Paternal Grandmother        breast   Healthy Daughter       Social History   Substance and Sexual Activity  Drug Use No  ,   Social History   Substance and Sexual Activity  Alcohol Use No   Comment: currently breast feeding but has 1 drink/wk when not breastfeeding  ,   Social History   Tobacco Use  Smoking Status Never Smoker  Smokeless Tobacco Never Used  ,   Social History   Substance and Sexual Activity  Sexual Activity Yes   Birth control/protection: Pill    Current Outpatient Medications on File Prior to Visit  Medication Sig Dispense Refill   cetirizine (ZYRTEC) 10 MG tablet Take 10 mg by mouth daily.     ipratropium (ATROVENT) 0.03 % nasal spray Place 2 sprays into both nostrils 3 (three) times daily. 30 mL 12   levothyroxine (SYNTHROID) 25 MCG tablet TAKE 1 TABLET BY MOUTH DAILY BEFORE BREAKFAST. 90 tablet 1   Multiple Vitamins-Minerals (MULTIVITAMIN ADULT) CHEW Chew 1 each by mouth daily.     norethindrone (MICRONOR,CAMILA,ERRIN) 0.35 MG tablet See admin instructions.  12   amoxicillin-clavulanate (AUGMENTIN) 875-125 MG tablet Take 1 tablet by mouth 2 (two) times daily. 14 tablet 0   No current facility-administered medications on file prior to visit.    Allergies: Patient has no known allergies.  Review of Systems: General:   Denies fever, chills,  unexplained weight loss.  Optho/Auditory:   Denies visual changes, blurred vision/LOV Respiratory:   Denies SOB, DOE more than baseline levels.   Cardiovascular:   Denies chest pain, palpitations, new onset peripheral edema  Gastrointestinal:   Denies nausea, vomiting, diarrhea.  Genitourinary: Denies dysuria, freq/ urgency, flank pain or discharge from genitals.  Endocrine:     Denies hot or cold intolerance, polyuria, polydipsia. Musculoskeletal:   Denies unexplained myalgias, joint swelling, unexplained arthralgias, gait problems.  Skin:  Denies rash, suspicious lesions Neurological:     Denies dizziness, unexplained weakness, numbness  Psychiatric/Behavioral:   Denies mood changes, suicidal or homicidal ideations, hallucinations    Objective:    Blood pressure 121/75, pulse 89, height 5\' 7"  (1.702 m), weight 187 lb 8 oz (85 kg), SpO2 100 %, currently breastfeeding. Body mass index is 29.37 kg/m. General Appearance:    Alert, cooperative, no distress, appears stated age  Head:    Normocephalic, without obvious abnormality, atraumatic  Eyes:    PERRL, conjunctiva/corneas  clear, EOM's intact, both eyes  Ears:    Normal TM's and external ear canals w/ middle ear effusion, both ears  Nose:   Nares normal, septum midline, mucosa normal, no drainage    or sinus tenderness  Throat:   Lips w/o lesion, mucosa moist, and tongue normal; teeth and   gums normal  Neck:   Supple, symmetrical, trachea midline, no adenopathy;    thyroid:  thyromegaly; no carotid or JVD  Back:     Symmetric, no curvature, ROM normal, no CVA tenderness  Lungs:     Clear to auscultation bilaterally, respirations unlabored, no       Wh/ R/ R  Chest Wall:    No tenderness or gross deformity; normal excursion   Heart:    Regular rate and rhythm, S1 and S2 normal, no murmur, rub   or gallop  Breast Exam:   Deferred to OB/GYN  Abdomen:     Soft, non-tender, bowel sounds active all four quadrants, NO   G/R/R, no masses,  no organomegaly  Genitalia:   Deferred to OB/GYN  Rectal:   Deferred to OB/GYN  Extremities:   Extremities normal, atraumatic, no cyanosis or gross edema  Pulses:   Normal  Skin:   Warm, dry, Skin color, texture, turgor normal, no obvious rashes or lesions Psych: No HI/SI, judgement and insight good, Euthymic mood. Full Affect.  Neurologic:   CNII-XII grossly intact, normal strength, sensation and reflexes throughout

## 2020-05-09 NOTE — Patient Instructions (Signed)
Preventive Care 34-34 Years Old, Female Preventive care refers to visits with your health care provider and lifestyle choices that can promote health and wellness. This includes:  A yearly physical exam. This may also be called an annual well check.  Regular dental visits and eye exams.  Immunizations.  Screening for certain conditions.  Healthy lifestyle choices, such as eating a healthy diet, getting regular exercise, not using drugs or products that contain nicotine and tobacco, and limiting alcohol use. What can I expect for my preventive care visit? Physical exam Your health care provider will check your:  Height and weight. This may be used to calculate body mass index (BMI), which tells if you are at a healthy weight.  Heart rate and blood pressure.  Skin for abnormal spots. Counseling Your health care provider may ask you questions about your:  Alcohol, tobacco, and drug use.  Emotional well-being.  Home and relationship well-being.  Sexual activity.  Eating habits.  Work and work Statistician.  Method of birth control.  Menstrual cycle.  Pregnancy history. What immunizations do I need?  Influenza (flu) vaccine  This is recommended every year. Tetanus, diphtheria, and pertussis (Tdap) vaccine  You may need a Td booster every 10 years. Varicella (chickenpox) vaccine  You may need this if you have not been vaccinated. Human papillomavirus (HPV) vaccine  If recommended by your health care provider, you may need three doses over 6 months. Measles, mumps, and rubella (MMR) vaccine  You may need at least one dose of MMR. You may also need a second dose. Meningococcal conjugate (MenACWY) vaccine  One dose is recommended if you are age 34-21 years and a first-year college student living in a residence hall, or if you have one of several medical conditions. You may also need additional booster doses. Pneumococcal conjugate (PCV13) vaccine  You may need  this if you have certain conditions and were not previously vaccinated. Pneumococcal polysaccharide (PPSV23) vaccine  You may need one or two doses if you smoke cigarettes or if you have certain conditions. Hepatitis A vaccine  You may need this if you have certain conditions or if you travel or work in places where you may be exposed to hepatitis A. Hepatitis B vaccine  You may need this if you have certain conditions or if you travel or work in places where you may be exposed to hepatitis B. Haemophilus influenzae type b (Hib) vaccine  You may need this if you have certain conditions. You may receive vaccines as individual doses or as more than one vaccine together in one shot (combination vaccines). Talk with your health care provider about the risks and benefits of combination vaccines. What tests do I need?  Blood tests  Lipid and cholesterol levels. These may be checked every 5 years starting at age 34.  Hepatitis C test.  Hepatitis B test. Screening  Diabetes screening. This is done by checking your blood sugar (glucose) after you have not eaten for a while (fasting).  Sexually transmitted disease (STD) testing.  BRCA-related cancer screening. This may be done if you have a family history of breast, ovarian, tubal, or peritoneal cancers.  Pelvic exam and Pap test. This may be done every 3 years starting at age 34. Starting at age 8, this may be done every 5 years if you have a Pap test in combination with an HPV test. Talk with your health care provider about your test results, treatment options, and if necessary, the need for more tests.  Follow these instructions at home: Eating and drinking   Eat a diet that includes fresh fruits and vegetables, whole grains, lean protein, and low-fat dairy.  Take vitamin and mineral supplements as recommended by your health care provider.  Do not drink alcohol if: ? Your health care provider tells you not to drink. ? You are  pregnant, may be pregnant, or are planning to become pregnant.  If you drink alcohol: ? Limit how much you have to 0-1 drink a day. ? Be aware of how much alcohol is in your drink. In the [place] one drink equals one 12 oz bottle of beer (355 mL), one 5 oz glass of wine (148 mL), or one 1 oz glass of hard liquor (44 mL). Lifestyle  Take daily care of your teeth and gums.  Stay active. Exercise for at least 30 minutes on 5 or more days each week.  Do not use any products that contain nicotine or tobacco, such as cigarettes, e-cigarettes, and chewing tobacco. If you need help quitting, ask your health care provider.  If you are sexually active, practice safe sex. Use a condom or other form of birth control (contraception) in order to prevent pregnancy and STIs (sexually transmitted infections). If you plan to become pregnant, see your health care provider for a preconception visit. What's next?  Visit your health care provider once a year for a well check visit.  Ask your health care provider how often you should have your eyes and teeth checked.  Stay up to date on all vaccines. This information is not intended to replace advice given to you by your health care provider. Make sure you discuss any questions you have with your health care provider. Document Revised: 05/13/2018 Document Reviewed: 05/13/2018 Elsevier Patient Education  2020 [employer].

## 2020-05-14 ENCOUNTER — Encounter: Payer: Self-pay | Admitting: Physician Assistant

## 2020-05-14 ENCOUNTER — Ambulatory Visit
Admission: RE | Admit: 2020-05-14 | Discharge: 2020-05-14 | Disposition: A | Discharge status: Home / Self Care | Payer: 59 | Source: Ambulatory Visit | Attending: Physician Assistant | Admitting: Physician Assistant

## 2020-05-14 DIAGNOSIS — E049 Nontoxic goiter, unspecified: Secondary | ICD-10-CM

## 2020-05-14 DIAGNOSIS — E039 Hypothyroidism, unspecified: Secondary | ICD-10-CM

## 2020-07-04 ENCOUNTER — Other Ambulatory Visit: Payer: 59

## 2020-07-04 ENCOUNTER — Other Ambulatory Visit: Payer: Self-pay

## 2020-07-04 DIAGNOSIS — E7800 Pure hypercholesterolemia, unspecified: Secondary | ICD-10-CM | POA: Diagnosis not present

## 2020-07-05 ENCOUNTER — Encounter: Payer: Self-pay | Admitting: Physician Assistant

## 2020-07-05 LAB — LIPID PANEL
Chol/HDL Ratio: 3.4 ratio (ref 0.0–4.4)
Cholesterol, Total: 186 mg/dL (ref 100–199)
HDL: 54 mg/dL (ref >39)
LDL Chol Calc (NIH): 105 mg/dL — ABNORMAL HIGH (ref 0–99)
Triglycerides: 152 mg/dL — ABNORMAL HIGH (ref 0–149)
VLDL Cholesterol Cal: 27 mg/dL (ref 5–40)

## 2020-07-16 ENCOUNTER — Other Ambulatory Visit: Payer: Self-pay | Admitting: Physician Assistant

## 2020-08-21 DIAGNOSIS — L814 Other melanin hyperpigmentation: Secondary | ICD-10-CM | POA: Diagnosis not present

## 2020-08-21 DIAGNOSIS — L905 Scar conditions and fibrosis of skin: Secondary | ICD-10-CM | POA: Diagnosis not present

## 2020-08-21 DIAGNOSIS — Z872 Personal history of diseases of the skin and subcutaneous tissue: Secondary | ICD-10-CM | POA: Diagnosis not present

## 2020-08-21 DIAGNOSIS — D225 Melanocytic nevi of trunk: Secondary | ICD-10-CM | POA: Diagnosis not present

## 2020-08-31 ENCOUNTER — Other Ambulatory Visit: Payer: Self-pay | Admitting: Emergency Medicine

## 2020-08-31 ENCOUNTER — Telehealth: Payer: 59 | Admitting: Emergency Medicine

## 2020-08-31 DIAGNOSIS — L03818 Cellulitis of other sites: Secondary | ICD-10-CM | POA: Diagnosis not present

## 2020-08-31 MED ORDER — DOXYCYCLINE HYCLATE 100 MG PO CAPS: 100.0000 mg | ORAL_CAPSULE | Freq: Two times a day (BID) | ORAL | 0 refills | Status: DC

## 2020-08-31 NOTE — Progress Notes (Signed)
E Visit for Cellulitis  We are sorry that you are not feeling well. Here is how we plan to help!  Based on what you shared with me it looks like you have cellulitis.  Cellulitis looks like areas of skin redness, swelling, and warmth; it develops as a result of bacteria entering under the skin. Little red spots and/or bleeding can be seen in skin, and tiny surface sacs containing fluid can occur. Fever can be present. Cellulitis is almost always on one side of a body, and the lower limbs are the most common site of involvement.   I have prescribed:  Doxycycline 100mg , take twice daily for 10 days.  Cellulitis of the face/ear can rapidly worsen.  If you worsen, please be seen in person.  Continue to try and express the pus from the piercing/ear lobe.  Use warm compresses.    HOME CARE:  . Take your medications as ordered and take all of them, even if the skin irritation appears to be healing.   GET HELP RIGHT AWAY IF:  . Symptoms that don't begin to go away within 48 hours. . Severe redness persists or worsens . If the area turns color, spreads or swells. . If it blisters and opens, develops yellow-brown crust or bleeds. . You develop a fever or chills. . If the pain increases or becomes unbearable.  . Are unable to keep fluids and food down.  MAKE SURE YOU    Understand these instructions.  Will watch your condition.  Will get help right away if you are not doing well or get worse.  Thank you for choosing an e-visit. Your e-visit answers were reviewed by a board certified advanced clinical practitioner to complete your personal care plan. Depending upon the condition, your plan could have included both over the counter or prescription medications. Please review your pharmacy choice. Make sure the pharmacy is open so you can pick up prescription now. If there is a problem, you may contact your provider through [employer] and have the prescription routed to another  pharmacy. Your safety is important to [place]. If you have drug allergies check your prescription carefully.  For the next 24 hours you can use MyChart to ask questions about today's visit, request a non-urgent call back, or ask for a work or school excuse. You will get an email in the next two days asking about your experience. I hope that your e-visit has been valuable and will speed your recovery.  Approximately 5 minutes was used in reviewing the patient's chart, questionnaire, prescribing medications, and documentation.

## 2020-09-17 ENCOUNTER — Other Ambulatory Visit (HOSPITAL_COMMUNITY): Payer: Self-pay | Admitting: Obstetrics and Gynecology

## 2020-09-25 DIAGNOSIS — M9902 Segmental and somatic dysfunction of thoracic region: Secondary | ICD-10-CM | POA: Diagnosis not present

## 2020-09-25 DIAGNOSIS — M9901 Segmental and somatic dysfunction of cervical region: Secondary | ICD-10-CM | POA: Diagnosis not present

## 2020-09-25 DIAGNOSIS — M9903 Segmental and somatic dysfunction of lumbar region: Secondary | ICD-10-CM | POA: Diagnosis not present

## 2020-09-25 DIAGNOSIS — M5431 Sciatica, right side: Secondary | ICD-10-CM | POA: Diagnosis not present

## 2020-09-25 DIAGNOSIS — R519 Headache, unspecified: Secondary | ICD-10-CM | POA: Diagnosis not present

## 2020-10-08 ENCOUNTER — Other Ambulatory Visit (HOSPITAL_COMMUNITY): Payer: Self-pay | Admitting: Obstetrics and Gynecology

## 2020-10-08 DIAGNOSIS — Z01419 Encounter for gynecological examination (general) (routine) without abnormal findings: Secondary | ICD-10-CM | POA: Diagnosis not present

## 2020-10-08 DIAGNOSIS — Z760 Encounter for issue of repeat prescription: Secondary | ICD-10-CM | POA: Diagnosis not present

## 2020-10-08 DIAGNOSIS — Z6828 Body mass index (BMI) 28.0-28.9, adult: Secondary | ICD-10-CM | POA: Diagnosis not present

## 2020-10-16 LAB — HM PAP SMEAR: HM Pap smear: NEGATIVE

## 2020-11-08 ENCOUNTER — Ambulatory Visit: Payer: 59 | Admitting: Physician Assistant

## 2020-11-09 ENCOUNTER — Encounter: Payer: Self-pay | Admitting: Physician Assistant

## 2020-11-09 ENCOUNTER — Other Ambulatory Visit: Payer: Self-pay

## 2020-11-09 ENCOUNTER — Ambulatory Visit: Payer: 59 | Admitting: Physician Assistant

## 2020-11-09 VITALS — BP 101/61 | HR 75 | Temp 98.8°F | Wt 174.8 lb

## 2020-11-09 DIAGNOSIS — E039 Hypothyroidism, unspecified: Secondary | ICD-10-CM | POA: Diagnosis not present

## 2020-11-09 DIAGNOSIS — Z6827 Body mass index (BMI) 27.0-27.9, adult: Secondary | ICD-10-CM | POA: Diagnosis not present

## 2020-11-09 DIAGNOSIS — E7800 Pure hypercholesterolemia, unspecified: Secondary | ICD-10-CM

## 2020-11-09 NOTE — Patient Instructions (Signed)

## 2020-11-09 NOTE — Progress Notes (Signed)
Established Patient Office Visit  Subjective:  Patient ID: Jacqueline Chen, female    DOB: 1986/02/23  Age: 35 y.o. MRN: [id number]  CC:  Chief Complaint  Patient presents with  . Follow-up    HPI Jacqueline Chen presents for follow up on hyperlipidemia and hypothyroidism. Has no acute concerns, is doing well.  HLD: Reports has started Optavia for weight loss and diet changes which includes reducing fats.  Hypothyroidism: Reports medication compliance. Denies fatigue, palpitations, hair loss or weight gain.   Past Medical History:  Diagnosis Date  . Goiter 2015   multinodular goiter  . Thyroid disease    [employer] 05/09/2020    History reviewed. No pertinent surgical history.  Family History  Problem Relation Age of Onset  . Healthy Mother   . Heart attack Father   . Hyperlipidemia Father   . Hypertension Father   . Healthy Sister   . Healthy Brother   . Healthy Daughter   . Cancer Paternal Grandmother        breast  . Healthy Daughter     Social History   Socioeconomic History  . Marital status: Married    Spouse name: Not on file  . Number of children: Not on file  . Years of education: Not on file  . Highest education level: Not on file  Occupational History  . Not on file  Tobacco Use  . Smoking status: Never Smoker  . Smokeless tobacco: Never Used  Vaping Use  . Vaping Use: Never used  Substance and Sexual Activity  . Alcohol use: No    Comment: currently breast feeding but has 1 drink/wk when not breastfeeding  . Drug use: No  . Sexual activity: Yes    Birth control/protection: Pill  Other Topics Concern  . Not on file  Social History Narrative  . Not on file   Social Determinants of Health   Financial Resource Strain: Not on file  Food Insecurity: Not on file  Transportation Needs: Not on file  Physical Activity: Not on file  Stress: Not on file  Social Connections: Not on file  Intimate Partner Violence: Not on file    Outpatient  Medications Prior to Visit  Medication Sig Dispense Refill  . cetirizine (ZYRTEC) 10 MG tablet Take 10 mg by mouth daily.    Marland Kitchen ipratropium (ATROVENT) 0.03 % nasal spray Place 2 sprays into both nostrils 3 (three) times daily. 30 mL 12  . levothyroxine (SYNTHROID) 25 MCG tablet TAKE 1 TABLET BY MOUTH DAILY BEFORE BREAKFAST. 90 tablet 1  . Multiple Vitamins-Minerals (MULTIVITAMIN ADULT) CHEW Chew 1 each by mouth daily.    . norethindrone (MICRONOR,CAMILA,ERRIN) 0.35 MG tablet See admin instructions.  12  . norethindrone-ethinyl estradiol (LOESTRIN FE) 1-20 MG-MCG tablet Take 1 tablet by mouth daily.    Marland Kitchen doxycycline (VIBRAMYCIN) 100 MG capsule Take 1 capsule (100 mg total) by mouth 2 (two) times daily. 20 capsule 0   No facility-administered medications prior to visit.    No Known Allergies  ROS Review of Systems A fourteen system review of systems was performed and found to be positive as per HPI.   Objective:    Physical Exam General:  Well Developed, well nourished, appropriate for stated age.  Neuro:  Alert and oriented,  extra-ocular muscles intact  HEENT:  Normocephalic, atraumatic, neck supple, +goiter Skin:  no gross rash, warm, pink. Cardiac:  RRR, S1 S2 w/o murmur Respiratory:  ECTA B/L w/o wheezing, Not using accessory muscles,  speaking in full sentences- unlabored. Vascular:  Ext warm, no cyanosis apprec.; cap RF less 2 sec. Psych:  No HI/SI, judgement and insight good, Euthymic mood. Full Affect.   BP 101/61   Pulse 75   Temp 98.8 F (37.1 C)   Wt 174 lb 12.8 oz (79.3 kg)   SpO2 99%   BMI 27.38 kg/m  Wt Readings from Last 3 Encounters:  11/09/20 174 lb 12.8 oz (79.3 kg)  05/09/20 187 lb 8 oz (85 kg)  05/10/19 176 lb 4.8 oz (80 kg)     Health Maintenance Due  Topic Date Due  . Hepatitis C Screening  Never done  . PAP SMEAR-Modifier  11/22/2019    There are no preventive care reminders to display for this patient.  Lab Results  Component Value Date    TSH 1.860 05/02/2020   Lab Results  Component Value Date   WBC 4.2 05/02/2020   HGB 13.5 05/02/2020   HCT 40.5 05/02/2020   MCV 91 05/02/2020   PLT 313 05/02/2020   Lab Results  Component Value Date   NA 138 05/02/2020   K 4.6 05/02/2020   CO2 23 05/02/2020   GLUCOSE 92 05/02/2020   BUN 10 05/02/2020   CREATININE 0.75 05/02/2020   BILITOT 0.4 05/02/2020   ALKPHOS 47 (L) 05/02/2020   AST 17 05/02/2020   ALT 13 05/02/2020   PROT 6.5 05/02/2020   ALBUMIN 4.3 05/02/2020   CALCIUM 9.3 05/02/2020   Lab Results  Component Value Date   CHOL 186 07/04/2020   Lab Results  Component Value Date   HDL 54 07/04/2020   Lab Results  Component Value Date   LDLCALC 105 (H) 07/04/2020   Lab Results  Component Value Date   TRIG 152 (H) 07/04/2020   Lab Results  Component Value Date   CHOLHDL 3.4 07/04/2020   Lab Results  Component Value Date   HGBA1C 5.2 05/02/2020      Assessment & Plan:   Problem List Items Addressed This Visit   None   Visit Diagnoses    Elevated LDL cholesterol level    -  Primary   Relevant Orders   Lipid Profile   Hypothyroidism, unspecified type       Relevant Orders   TSH   BMI 27.0-27.9,adult         Elevated LDL cholesterol level: -Last lipid panel: total cholesterol 186, triglycerides 152, HDL 54, LDL 105 -Follow a heart healthy diet. -Will continue to monitor and repeat lipid panel today.  Hypothyroidism, unspecified type: -Last TSH 1.860, wnl -Continue current medication regimen. Will repeat TSH today.  BMI 27.0-27.9: -Associated with elevated LDL. -Per chart review, patient has lost approximately 13 pounds. -Encourage to continue weight loss efforts with dietary changes.  No orders of the defined types were placed in this encounter.   Follow-up: Return in about 6 months (around 05/09/2021) for CPE and FBW.   Note:  This note was prepared with assistance of Dragon voice recognition software. Occasional wrong-word or  sound-a-like substitutions may have occurred due to the inherent limitations of voice recognition software.   Lorrene Reid, PA-C

## 2020-11-10 LAB — LIPID PANEL
Chol/HDL Ratio: 3.5 ratio (ref 0.0–4.4)
Cholesterol, Total: 149 mg/dL (ref 100–199)
HDL: 43 mg/dL (ref >39)
LDL Chol Calc (NIH): 87 mg/dL (ref 0–99)
Triglycerides: 105 mg/dL (ref 0–149)
VLDL Cholesterol Cal: 19 mg/dL (ref 5–40)

## 2020-11-10 LAB — TSH: TSH: 2.57 u[IU]/mL (ref 0.450–4.500)

## 2020-12-12 ENCOUNTER — Other Ambulatory Visit: Payer: Self-pay | Admitting: Physician Assistant

## 2020-12-12 ENCOUNTER — Encounter: Payer: Self-pay | Admitting: Physician Assistant

## 2020-12-12 DIAGNOSIS — J302 Other seasonal allergic rhinitis: Secondary | ICD-10-CM

## 2020-12-12 MED ORDER — FLUTICASONE PROPIONATE 50 MCG/ACT NA SUSP: 1.0000 | Freq: Every day | NASAL | 0 refills | Status: DC

## 2021-01-10 ENCOUNTER — Other Ambulatory Visit (HOSPITAL_COMMUNITY): Payer: Self-pay

## 2021-01-10 ENCOUNTER — Other Ambulatory Visit: Payer: Self-pay | Admitting: Physician Assistant

## 2021-01-10 MED ORDER — LEVOTHYROXINE SODIUM 25 MCG PO TABS
ORAL_TABLET | Freq: Every day | ORAL | 1 refills | Status: DC
  Filled 2021-01-10: qty 90, 90d supply, fill #0
  Filled 2021-04-09: qty 60, 60d supply, fill #0
  Filled 2021-04-09: qty 90, 90d supply, fill #1
  Filled 2021-06-09: qty 30, 30d supply, fill #1

## 2021-01-15 ENCOUNTER — Encounter: Payer: Self-pay | Admitting: Physician Assistant

## 2021-02-26 ENCOUNTER — Other Ambulatory Visit (HOSPITAL_COMMUNITY): Payer: Self-pay

## 2021-02-26 MED FILL — Norethindrone Ace & Ethinyl Estradiol-FE Tab 1 MG-20 MCG: ORAL | 84 days supply | Qty: 84 | Fill #0 | Status: AC

## 2021-04-09 ENCOUNTER — Other Ambulatory Visit (HOSPITAL_COMMUNITY): Payer: Self-pay

## 2021-04-13 ENCOUNTER — Telehealth: Payer: 59 | Admitting: Nurse Practitioner

## 2021-04-13 DIAGNOSIS — J321 Chronic frontal sinusitis: Secondary | ICD-10-CM | POA: Diagnosis not present

## 2021-04-13 MED ORDER — AMOXICILLIN-POT CLAVULANATE 875-125 MG PO TABS: 1.0000 | ORAL_TABLET | Freq: Two times a day (BID) | ORAL | 0 refills | Status: AC

## 2021-04-13 NOTE — Progress Notes (Signed)
E-Visit for Sinus Problems  We are sorry that you are not feeling well.  Here is how we plan to help!  Based on what you have shared with me it looks like you have sinusitis.  Sinusitis is inflammation and infection in the sinus cavities of the head.  Based on your presentation I believe you most likely have Acute Bacterial Sinusitis.  This is an infection caused by bacteria and is treated with antibiotics. I have prescribed Augmentin '875mg'$ /'125mg'$  one tablet twice daily with food, for 7 days. You may use an oral decongestant such as Mucinex D or if you have glaucoma or high blood pressure use plain Mucinex. Saline nasal spray help and can safely be used as often as needed for congestion.  If you develop worsening sinus pain, fever or notice severe headache and vision changes, or if symptoms are not better after completion of antibiotic, please schedule an appointment with a health care provider.    Sinus infections are not as easily transmitted as other respiratory infection, however we still recommend that you avoid close contact with loved ones, especially the very young and elderly.  Remember to wash your hands thoroughly throughout the day as this is the number one way to prevent the spread of infection!  Home Care: Only take medications as instructed by your medical team. Complete the entire course of an antibiotic. Do not take these medications with alcohol. A steam or ultrasonic humidifier can help congestion.  You can place a towel over your head and breathe in the steam from hot water coming from a faucet. Avoid close contacts especially the very young and the elderly. Cover your mouth when you cough or sneeze. Always remember to wash your hands.  Get Help Right Away If: You develop worsening fever or sinus pain. You develop a severe head ache or visual changes. Your symptoms persist after you have completed your treatment plan.  Make sure you Understand these instructions. Will watch  your condition. Will get help right away if you are not doing well or get worse.  Thank you for choosing an e-visit.  Your e-visit answers were reviewed by a board certified advanced clinical practitioner to complete your personal care plan. Depending upon the condition, your plan could have included both over the counter or prescription medications.  Please review your pharmacy choice. Make sure the pharmacy is open so you can pick up prescription now. If there is a problem, you may contact your provider through [employer] and have the prescription routed to another pharmacy.  Your safety is important to [place]. If you have drug allergies check your prescription carefully.   For the next 24 hours you can use MyChart to ask questions about today's visit, request a non-urgent call back, or ask for a work or school excuse. You will get an email in the next two days asking about your experience. I hope that your e-visit has been valuable and will speed your recovery.   I spent approximately 7 minutes reviewing the patient's history, current symptoms and coordinating their plan of care today.    Meds ordered this encounter  Medications   amoxicillin-clavulanate (AUGMENTIN) 875-125 MG tablet    Sig: Take 1 tablet by mouth 2 (two) times daily for 7 days. Take with food    Dispense:  14 tablet    Refill:  0

## 2021-05-16 ENCOUNTER — Other Ambulatory Visit: Payer: Self-pay

## 2021-05-16 DIAGNOSIS — Z130 Encounter for screening for diseases of the blood and blood-forming organs and certain disorders involving the immune mechanism: Secondary | ICD-10-CM

## 2021-05-16 DIAGNOSIS — Z1321 Encounter for screening for nutritional disorder: Secondary | ICD-10-CM

## 2021-05-20 MED FILL — Norethindrone Ace & Ethinyl Estradiol-FE Tab 1 MG-20 MCG: ORAL | 84 days supply | Qty: 84 | Fill #1 | Status: CN

## 2021-05-21 ENCOUNTER — Other Ambulatory Visit: Payer: 59

## 2021-05-21 ENCOUNTER — Other Ambulatory Visit: Payer: Self-pay

## 2021-05-21 ENCOUNTER — Other Ambulatory Visit (HOSPITAL_COMMUNITY): Payer: Self-pay

## 2021-05-21 DIAGNOSIS — Z130 Encounter for screening for diseases of the blood and blood-forming organs and certain disorders involving the immune mechanism: Secondary | ICD-10-CM

## 2021-05-21 DIAGNOSIS — Z13228 Encounter for screening for other metabolic disorders: Secondary | ICD-10-CM | POA: Diagnosis not present

## 2021-05-21 DIAGNOSIS — Z1321 Encounter for screening for nutritional disorder: Secondary | ICD-10-CM | POA: Diagnosis not present

## 2021-05-21 DIAGNOSIS — Z1329 Encounter for screening for other suspected endocrine disorder: Secondary | ICD-10-CM | POA: Diagnosis not present

## 2021-05-21 MED FILL — Norethindrone Ace & Ethinyl Estradiol-FE Tab 1 MG-20 MCG: ORAL | 84 days supply | Qty: 84 | Fill #0 | Status: AC

## 2021-05-22 LAB — CBC WITH DIFFERENTIAL/PLATELET
Basophils Absolute: 0 10*3/uL (ref 0.0–0.2)
Basos: 1 %
EOS (ABSOLUTE): 0.2 10*3/uL (ref 0.0–0.4)
Eos: 4 %
Hematocrit: 41 % (ref 34.0–46.6)
Hemoglobin: 13.6 g/dL (ref 11.1–15.9)
Immature Grans (Abs): 0 10*3/uL (ref 0.0–0.1)
Immature Granulocytes: 0 %
Lymphocytes Absolute: 2.2 10*3/uL (ref 0.7–3.1)
Lymphs: 42 %
MCH: 30.8 pg (ref 26.6–33.0)
MCHC: 33.2 g/dL (ref 31.5–35.7)
MCV: 93 fL (ref 79–97)
Monocytes Absolute: 0.3 10*3/uL (ref 0.1–0.9)
Monocytes: 6 %
Neutrophils Absolute: 2.5 10*3/uL (ref 1.4–7.0)
Neutrophils: 47 %
Platelets: 336 10*3/uL (ref 150–450)
RBC: 4.42 x10E6/uL (ref 3.77–5.28)
RDW: 12.1 % (ref 11.7–15.4)
WBC: 5.1 10*3/uL (ref 3.4–10.8)

## 2021-05-22 LAB — LIPID PANEL
Chol/HDL Ratio: 3 ratio (ref 0.0–4.4)
Cholesterol, Total: 169 mg/dL (ref 100–199)
HDL: 56 mg/dL (ref >39)
LDL Chol Calc (NIH): 95 mg/dL (ref 0–99)
Triglycerides: 98 mg/dL (ref 0–149)
VLDL Cholesterol Cal: 18 mg/dL (ref 5–40)

## 2021-05-22 LAB — COMPREHENSIVE METABOLIC PANEL
ALT: 8 IU/L (ref 0–32)
AST: 16 IU/L (ref 0–40)
Albumin/Globulin Ratio: 1.8 (ref 1.2–2.2)
Albumin: 4.2 g/dL (ref 3.8–4.8)
Alkaline Phosphatase: 41 IU/L — ABNORMAL LOW (ref 44–121)
BUN/Creatinine Ratio: 18 (ref 9–23)
BUN: 14 mg/dL (ref 6–20)
Bilirubin Total: 0.4 mg/dL (ref 0.0–1.2)
CO2: 21 mmol/L (ref 20–29)
Calcium: 9 mg/dL (ref 8.7–10.2)
Chloride: 103 mmol/L (ref 96–106)
Creatinine, Ser: 0.79 mg/dL (ref 0.57–1.00)
Globulin, Total: 2.4 g/dL (ref 1.5–4.5)
Glucose: 90 mg/dL (ref 65–99)
Potassium: 4.7 mmol/L (ref 3.5–5.2)
Sodium: 138 mmol/L (ref 134–144)
Total Protein: 6.6 g/dL (ref 6.0–8.5)
eGFR: 100 mL/min/{1.73_m2} (ref >59)

## 2021-05-22 LAB — HEMOGLOBIN A1C
Est. average glucose Bld gHb Est-mCnc: 105 mg/dL
Hgb A1c MFr Bld: 5.3 % (ref 4.8–5.6)

## 2021-05-22 LAB — TSH: TSH: 1.91 u[IU]/mL (ref 0.450–4.500)

## 2021-05-24 ENCOUNTER — Other Ambulatory Visit: Payer: Self-pay

## 2021-05-24 ENCOUNTER — Encounter: Payer: Self-pay | Admitting: Physician Assistant

## 2021-05-24 ENCOUNTER — Ambulatory Visit (INDEPENDENT_AMBULATORY_CARE_PROVIDER_SITE_OTHER): Payer: 59 | Admitting: Physician Assistant

## 2021-05-24 VITALS — BP 103/70 | HR 76 | Temp 98.3°F | Ht 67.0 in | Wt 163.8 lb

## 2021-05-24 DIAGNOSIS — Z0000 Encounter for general adult medical examination without abnormal findings: Secondary | ICD-10-CM

## 2021-05-24 DIAGNOSIS — E049 Nontoxic goiter, unspecified: Secondary | ICD-10-CM

## 2021-05-24 DIAGNOSIS — E039 Hypothyroidism, unspecified: Secondary | ICD-10-CM

## 2021-05-24 NOTE — Progress Notes (Signed)
Subjective:     Jacqueline Chen is a 35 y.o. female and is here for a comprehensive physical exam. The patient reports no problems.  Social History   Socioeconomic History   Marital status: Married    Spouse name: Not on file   Number of children: Not on file   Years of education: Not on file   Highest education level: Not on file  Occupational History   Not on file  Tobacco Use   Smoking status: Never   Smokeless tobacco: Never  Vaping Use   Vaping Use: Never used  Substance and Sexual Activity   Alcohol use: No    Comment: currently breast feeding but has 1 drink/wk when not breastfeeding   Drug use: No   Sexual activity: Yes    Birth control/protection: Pill  Other Topics Concern   Not on file  Social History Narrative   Not on file   Social Determinants of Health   Financial Resource Strain: Not on file  Food Insecurity: Not on file  Transportation Needs: Not on file  Physical Activity: Not on file  Stress: Not on file  Social Connections: Not on file  Intimate Partner Violence: Not on file   Health Maintenance  Topic Date Due   Hepatitis C Screening  Never done   INFLUENZA VACCINE  04/15/2021   PAP SMEAR-Modifier  10/17/2023   TETANUS/TDAP  01/20/2024   COVID-19 Vaccine  Completed   HIV Screening  Completed   Pneumococcal Vaccine 42-42 Years old  Aged Out   HPV VACCINES  Aged Out    The following portions of the patient's history were reviewed and updated as appropriate: allergies, current medications, past family history, past medical history, past social history, past surgical history, and problem list.  Review of Systems Pertinent items noted in HPI and remainder of comprehensive ROS otherwise negative.   Objective:    BP 103/70   Pulse 76   Temp 98.3 F (36.8 C)   Ht '5\' 7"'$  (1.702 m)   Wt 163 lb 12.8 oz (74.3 kg)   SpO2 99%   Breastfeeding No   BMI 25.65 kg/m  General appearance: alert, cooperative, and no distress Head: Normocephalic,  without obvious abnormality, atraumatic Eyes: conjunctivae/corneas clear. PERRL, EOM's intact. Fundi benign. Ears: normal TM's and external ear canals both ears Nose: Nares normal. Septum midline. Mucosa normal. No drainage or sinus tenderness. Throat: normal findings: lips normal without lesions, gums healthy, teeth intact, non-carious, and tongue midline and normal and abnormal findings: left tonsil enlarged Neck: no adenopathy, no carotid bruit, no JVD, supple, symmetrical, trachea midline, and thyroid: enlarged Back: symmetric, no curvature. ROM normal. No CVA tenderness. Lungs: clear to auscultation bilaterally Heart: regular rate and rhythm, S1, S2 normal, no murmur, click, rub or gallop Abdomen: soft, non-tender; bowel sounds normal; no masses,  no organomegaly Extremities: extremities normal, atraumatic, no cyanosis or edema Pulses: 2+ and symmetric Skin: Skin color, texture, turgor normal. No rashes or lesions Lymph nodes: Cervical adenopathy: normal and Supraclavicular adenopathy: normal Neurologic: Grossly normal    Assessment:    Healthy female exam.     Plan:  -Followed by OB/GYN ([facility]), UTD pap smear. -Will obtain influenza vaccine with work. -Discussed most recent labs which are essentially within normal limits or stable from prior. Bad cholesterol remains within normal range. -Continue current medication regimen. -Encourage to continue weight loss efforts with dietary and lifestyle changes. -Follow up in 1 year for CPE and FBW.  See  After Visit Summary for Counseling Recommendations

## 2021-05-24 NOTE — Patient Instructions (Signed)
Preventive Care 21-35 Years Old, Female Preventive care refers to lifestyle choices and visits with your health care provider that can promote health and wellness. This includes: A yearly physical exam. This is also called an annual wellness visit. Regular dental and eye exams. Immunizations. Screening for certain conditions. Healthy lifestyle choices, such as: Eating a healthy diet. Getting regular exercise. Not using drugs or products that contain nicotine and tobacco. Limiting alcohol use. What can I expect for my preventive care visit? Physical exam Your health care provider may check your: Height and weight. These may be used to calculate your BMI (body mass index). BMI is a measurement that tells if you are at a healthy weight. Heart rate and blood pressure. Body temperature. Skin for abnormal spots. Counseling Your health care provider may ask you questions about your: Past medical problems. Family's medical history. Alcohol, tobacco, and drug use. Emotional well-being. Home life and relationship well-being. Sexual activity. Diet, exercise, and sleep habits. Work and work environment. Access to firearms. Method of birth control. Menstrual cycle. Pregnancy history. What immunizations do I need? Vaccines are usually given at various ages, according to a schedule. Your health care provider will recommend vaccines for you based on your age, medical history, and lifestyle or other factors, such as travel or where you work. What tests do I need? Blood tests Lipid and cholesterol levels. These may be checked every 5 years starting at age 20. Hepatitis C test. Hepatitis B test. Screening Diabetes screening. This is done by checking your blood sugar (glucose) after you have not eaten for a while (fasting). STD (sexually transmitted disease) testing, if you are at risk. BRCA-related cancer screening. This may be done if you have a family history of breast, ovarian, tubal, or  peritoneal cancers. Pelvic exam and Pap test. This may be done every 3 years starting at age 21. Starting at age 30, this may be done every 5 years if you have a Pap test in combination with an HPV test. Talk with your health care provider about your test results, treatment options, and if necessary, the need for more tests. Follow these instructions at home: Eating and drinking  Eat a healthy diet that includes fresh fruits and vegetables, whole grains, lean protein, and low-fat dairy products. Take vitamin and mineral supplements as recommended by your health care provider. Do not drink alcohol if: Your health care provider tells you not to drink. You are pregnant, may be pregnant, or are planning to become pregnant. If you drink alcohol: Limit how much you have to 0-1 drink a day. Be aware of how much alcohol is in your drink. In the [place] one drink equals one 12 oz bottle of beer (355 mL), one 5 oz glass of wine (148 mL), or one 1 oz glass of hard liquor (44 mL). Lifestyle Take daily care of your teeth and gums. Brush your teeth every morning and night with fluoride toothpaste. Floss one time each day. Stay active. Exercise for at least 30 minutes 5 or more days each week. Do not use any products that contain nicotine or tobacco, such as cigarettes, e-cigarettes, and chewing tobacco. If you need help quitting, ask your health care provider. Do not use drugs. If you are sexually active, practice safe sex. Use a condom or other form of protection to prevent STIs (sexually transmitted infections). If you do not wish to become pregnant, use a form of birth control. If you plan to become pregnant, see your health care provider   for a prepregnancy visit. Find healthy ways to cope with stress, such as: Meditation, yoga, or listening to music. Journaling. Talking to a trusted person. Spending time with friends and family. Safety Always wear your seat belt while driving or riding in a  vehicle. Do not drive: If you have been drinking alcohol. Do not ride with someone who has been drinking. When you are tired or distracted. While texting. Wear a helmet and other protective equipment during sports activities. If you have firearms in your house, make sure you follow all gun safety procedures. Seek help if you have been physically or sexually abused. What's next? Go to your health care provider once a year for an annual wellness visit. Ask your health care provider how often you should have your eyes and teeth checked. Stay up to date on all vaccines. This information is not intended to replace advice given to you by your health care provider. Make sure you discuss any questions you have with your health care provider. Document Revised: 11/09/2020 Document Reviewed: 05/13/2018 Elsevier Patient Education  2022 [employer]  

## 2021-05-27 DIAGNOSIS — H5212 Myopia, left eye: Secondary | ICD-10-CM | POA: Diagnosis not present

## 2021-06-10 ENCOUNTER — Other Ambulatory Visit: Payer: Self-pay | Admitting: Physician Assistant

## 2021-06-10 ENCOUNTER — Other Ambulatory Visit (HOSPITAL_COMMUNITY): Payer: Self-pay

## 2021-06-10 MED ORDER — LEVOTHYROXINE SODIUM 25 MCG PO TABS
ORAL_TABLET | Freq: Every day | ORAL | 1 refills | Status: DC
  Filled 2021-06-10: qty 90, 90d supply, fill #0
  Filled 2021-09-04: qty 90, 90d supply, fill #1

## 2021-08-15 ENCOUNTER — Other Ambulatory Visit (HOSPITAL_COMMUNITY): Payer: Self-pay

## 2021-08-15 MED FILL — Norethindrone Ace & Ethinyl Estradiol-FE Tab 1 MG-20 MCG: ORAL | 84 days supply | Qty: 84 | Fill #1 | Status: AC

## 2021-08-22 DIAGNOSIS — L814 Other melanin hyperpigmentation: Secondary | ICD-10-CM | POA: Diagnosis not present

## 2021-08-22 DIAGNOSIS — D485 Neoplasm of uncertain behavior of skin: Secondary | ICD-10-CM | POA: Diagnosis not present

## 2021-08-22 DIAGNOSIS — D225 Melanocytic nevi of trunk: Secondary | ICD-10-CM | POA: Diagnosis not present

## 2021-08-22 DIAGNOSIS — Z872 Personal history of diseases of the skin and subcutaneous tissue: Secondary | ICD-10-CM | POA: Diagnosis not present

## 2021-08-22 DIAGNOSIS — L821 Other seborrheic keratosis: Secondary | ICD-10-CM | POA: Diagnosis not present

## 2021-09-04 ENCOUNTER — Other Ambulatory Visit (HOSPITAL_COMMUNITY): Payer: Self-pay

## 2021-10-28 ENCOUNTER — Other Ambulatory Visit (HOSPITAL_COMMUNITY): Payer: Self-pay

## 2021-10-28 ENCOUNTER — Telehealth: Payer: 59 | Admitting: Nurse Practitioner

## 2021-10-28 DIAGNOSIS — J069 Acute upper respiratory infection, unspecified: Secondary | ICD-10-CM | POA: Diagnosis not present

## 2021-10-28 MED ORDER — FLUTICASONE PROPIONATE 50 MCG/ACT NA SUSP
2.0000 | Freq: Every day | NASAL | 6 refills | Status: DC
Start: 2021-10-28 — End: 2023-03-05
  Filled 2021-10-28: qty 16, 30d supply, fill #0

## 2021-10-28 NOTE — Progress Notes (Signed)
E-Visit for Sinus Problems  We are sorry that you are not feeling well.  Here is how we plan to help!  Based on what you have shared with me it looks like you have sinusitis.  Sinusitis is inflammation and infection in the sinus cavities of the head.  Based on your presentation I believe you most likely have Acute Viral Sinusitis.This is an infection most likely caused by a virus. We are glad you were able to test for COVID and know that it is not that virus, but several other viruses are going around the community as well. We do not typically recommend antibiotics for sinus infections prior to 7-10 days of symptoms.   Providers prescribe antibiotics to treat infections caused by bacteria. Antibiotics are very powerful in treating bacterial infections when they are used properly. To maintain their effectiveness, they should be used only when necessary. Overuse of antibiotics has resulted in the development of superbugs that are resistant to treatment!    After careful review of your answers, I would not recommend an antibiotic for your condition.  Antibiotics are not effective against viruses and therefore should not be used to treat them. Common examples of infections caused by viruses include colds and flu    There is not specific treatment for viral sinusitis other than to help you with the symptoms until the infection runs its course.  You may use an oral decongestant such as Mucinex D or if you have glaucoma or high blood pressure use plain Mucinex. Saline nasal spray help and can safely be used as often as needed for congestion, I have prescribed: Fluticasone nasal spray two sprays in each nostril once a day  Some authorities believe that zinc sprays or the use of Echinacea may shorten the course of your symptoms.  Sinus infections are not as easily transmitted as other respiratory infection, however we still recommend that you avoid close contact with loved ones, especially the very young and  elderly.  Remember to wash your hands thoroughly throughout the day as this is the number one way to prevent the spread of infection!  Home Care: Only take medications as instructed by your medical team. Do not take these medications with alcohol. A steam or ultrasonic humidifier can help congestion.  You can place a towel over your head and breathe in the steam from hot water coming from a faucet. Avoid close contacts especially the very young and the elderly. Cover your mouth when you cough or sneeze. Always remember to wash your hands.  Get Help Right Away If: You develop worsening fever or sinus pain. You develop a severe head ache or visual changes. Your symptoms persist after you have completed your treatment plan.  Make sure you Understand these instructions. Will watch your condition. Will get help right away if you are not doing well or get worse.   Thank you for choosing an e-visit.  Your e-visit answers were reviewed by a board certified advanced clinical practitioner to complete your personal care plan. Depending upon the condition, your plan could have included both over the counter or prescription medications.  Please review your pharmacy choice. Make sure the pharmacy is open so you can pick up prescription now. If there is a problem, you may contact your provider through [employer] and have the prescription routed to another pharmacy.  Your safety is important to [place]. If you have drug allergies check your prescription carefully.   For the next 24 hours you can use  MyChart to ask questions about today's visit, request a non-urgent call back, or ask for a work or school excuse. You will get an email in the next two days asking about your experience. I hope that your e-visit has been valuable and will speed your recovery.  I spent approximately 7 minutes reviewing the patient's history, current symptoms and coordinating their plan of care today.    Meds ordered this  encounter  Medications   fluticasone (FLONASE) 50 MCG/ACT nasal spray    Sig: Place 2 sprays into both nostrils daily.    Dispense:  16 g    Refill:  6

## 2021-11-04 ENCOUNTER — Other Ambulatory Visit: Payer: Self-pay

## 2021-11-04 ENCOUNTER — Telehealth: Payer: 59 | Admitting: Physician Assistant

## 2021-11-04 DIAGNOSIS — B9689 Other specified bacterial agents as the cause of diseases classified elsewhere: Secondary | ICD-10-CM

## 2021-11-04 DIAGNOSIS — J019 Acute sinusitis, unspecified: Secondary | ICD-10-CM

## 2021-11-04 MED ORDER — NORETHIN ACE-ETH ESTRAD-FE 1-20 MG-MCG(24) PO TABS
1.0000 | ORAL_TABLET | Freq: Every day | ORAL | 0 refills | Status: DC
  Filled 2021-11-04: qty 84, 84d supply, fill #0

## 2021-11-04 MED ORDER — AMOXICILLIN-POT CLAVULANATE 875-125 MG PO TABS
1.0000 | ORAL_TABLET | Freq: Two times a day (BID) | ORAL | 0 refills | Status: DC
  Filled 2021-11-04: qty 14, 7d supply, fill #0

## 2021-11-04 NOTE — Progress Notes (Signed)
I have spent 5 minutes in review of e-visit questionnaire, review and updating patient chart, medical decision making and response to patient.   Aeris Hersman Cody Maks Cavallero, PA-C    

## 2021-11-04 NOTE — Progress Notes (Signed)

## 2021-11-05 ENCOUNTER — Other Ambulatory Visit: Payer: Self-pay

## 2021-11-06 ENCOUNTER — Other Ambulatory Visit: Payer: Self-pay

## 2021-12-02 DIAGNOSIS — Z6827 Body mass index (BMI) 27.0-27.9, adult: Secondary | ICD-10-CM | POA: Diagnosis not present

## 2021-12-02 DIAGNOSIS — Z124 Encounter for screening for malignant neoplasm of cervix: Secondary | ICD-10-CM | POA: Diagnosis not present

## 2021-12-02 DIAGNOSIS — Z760 Encounter for issue of repeat prescription: Secondary | ICD-10-CM | POA: Diagnosis not present

## 2021-12-02 DIAGNOSIS — Z01419 Encounter for gynecological examination (general) (routine) without abnormal findings: Secondary | ICD-10-CM | POA: Diagnosis not present

## 2021-12-03 ENCOUNTER — Other Ambulatory Visit: Payer: Self-pay

## 2021-12-03 MED ORDER — NORETHIN ACE-ETH ESTRAD-FE 1-20 MG-MCG PO TABS
1.0000 | ORAL_TABLET | Freq: Every day | ORAL | 4 refills | Status: DC
  Filled 2021-12-03: qty 84, 84d supply, fill #0
  Filled 2022-03-03: qty 84, 84d supply, fill #1
  Filled 2022-05-27: qty 84, 84d supply, fill #2
  Filled 2022-08-17: qty 84, 84d supply, fill #3
  Filled 2022-11-06: qty 84, 84d supply, fill #4

## 2021-12-05 ENCOUNTER — Other Ambulatory Visit: Payer: Self-pay

## 2021-12-05 ENCOUNTER — Other Ambulatory Visit (HOSPITAL_COMMUNITY): Payer: Self-pay

## 2021-12-05 ENCOUNTER — Other Ambulatory Visit: Payer: Self-pay | Admitting: Physician Assistant

## 2021-12-05 MED ORDER — LEVOTHYROXINE SODIUM 25 MCG PO TABS
25.0000 ug | ORAL_TABLET | Freq: Every day | ORAL | 1 refills | Status: DC
  Filled 2021-12-05 – 2022-03-03 (×2): qty 90, 90d supply, fill #0
  Filled 2022-03-03: qty 90, 90d supply, fill #1

## 2022-03-03 ENCOUNTER — Other Ambulatory Visit: Payer: Self-pay

## 2022-03-28 ENCOUNTER — Other Ambulatory Visit: Payer: Self-pay

## 2022-05-08 ENCOUNTER — Other Ambulatory Visit (HOSPITAL_COMMUNITY): Payer: Self-pay

## 2022-05-23 ENCOUNTER — Other Ambulatory Visit: Payer: 59

## 2022-05-27 ENCOUNTER — Other Ambulatory Visit: Payer: Self-pay

## 2022-05-30 ENCOUNTER — Encounter: Payer: 59 | Admitting: Physician Assistant

## 2022-06-02 ENCOUNTER — Other Ambulatory Visit: Payer: Self-pay | Admitting: Physician Assistant

## 2022-06-02 ENCOUNTER — Other Ambulatory Visit: Payer: Self-pay

## 2022-06-02 MED ORDER — LEVOTHYROXINE SODIUM 25 MCG PO TABS
25.0000 ug | ORAL_TABLET | Freq: Every day | ORAL | 1 refills | Status: DC
  Filled 2022-06-02: qty 90, 90d supply, fill #0
  Filled 2022-09-06 – 2022-09-09 (×2): qty 90, 90d supply, fill #1

## 2022-06-17 DIAGNOSIS — H5212 Myopia, left eye: Secondary | ICD-10-CM | POA: Diagnosis not present

## 2022-07-14 ENCOUNTER — Encounter: Payer: Self-pay | Admitting: Physician Assistant

## 2022-07-14 NOTE — Telephone Encounter (Signed)
Please call pt and reschedule for lab visit Thank you

## 2022-07-15 ENCOUNTER — Telehealth: Payer: 59 | Admitting: Physician Assistant

## 2022-07-15 ENCOUNTER — Other Ambulatory Visit: Payer: Self-pay

## 2022-07-15 DIAGNOSIS — J019 Acute sinusitis, unspecified: Secondary | ICD-10-CM | POA: Diagnosis not present

## 2022-07-15 DIAGNOSIS — B9689 Other specified bacterial agents as the cause of diseases classified elsewhere: Secondary | ICD-10-CM

## 2022-07-15 MED ORDER — AMOXICILLIN-POT CLAVULANATE 875-125 MG PO TABS
1.0000 | ORAL_TABLET | Freq: Two times a day (BID) | ORAL | 0 refills | Status: DC
  Filled 2022-07-15: qty 14, 7d supply, fill #0

## 2022-07-15 NOTE — Progress Notes (Signed)
I have spent 5 minutes in review of e-visit questionnaire, review and updating patient chart, medical decision making and response to patient.   Nizar Cutler Cody Jodye Scali, PA-C    

## 2022-07-15 NOTE — Progress Notes (Signed)

## 2022-07-17 ENCOUNTER — Other Ambulatory Visit: Payer: Self-pay

## 2022-07-17 DIAGNOSIS — Z13228 Encounter for screening for other metabolic disorders: Secondary | ICD-10-CM

## 2022-07-17 DIAGNOSIS — Z0000 Encounter for general adult medical examination without abnormal findings: Secondary | ICD-10-CM

## 2022-07-21 ENCOUNTER — Other Ambulatory Visit: Payer: 59

## 2022-07-21 DIAGNOSIS — Z0000 Encounter for general adult medical examination without abnormal findings: Secondary | ICD-10-CM | POA: Diagnosis not present

## 2022-07-21 DIAGNOSIS — Z13228 Encounter for screening for other metabolic disorders: Secondary | ICD-10-CM | POA: Diagnosis not present

## 2022-07-21 DIAGNOSIS — Z130 Encounter for screening for diseases of the blood and blood-forming organs and certain disorders involving the immune mechanism: Secondary | ICD-10-CM

## 2022-07-21 DIAGNOSIS — Z1329 Encounter for screening for other suspected endocrine disorder: Secondary | ICD-10-CM | POA: Diagnosis not present

## 2022-07-21 DIAGNOSIS — Z1321 Encounter for screening for nutritional disorder: Secondary | ICD-10-CM | POA: Diagnosis not present

## 2022-07-22 LAB — COMPREHENSIVE METABOLIC PANEL
ALT: 16 IU/L (ref 0–32)
AST: 20 IU/L (ref 0–40)
Albumin/Globulin Ratio: 1.7 (ref 1.2–2.2)
Albumin: 4.6 g/dL (ref 3.9–4.9)
Alkaline Phosphatase: 49 IU/L (ref 44–121)
BUN/Creatinine Ratio: 19 (ref 9–23)
BUN: 14 mg/dL (ref 6–20)
Bilirubin Total: 0.4 mg/dL (ref 0.0–1.2)
CO2: 22 mmol/L (ref 20–29)
Calcium: 9.6 mg/dL (ref 8.7–10.2)
Chloride: 103 mmol/L (ref 96–106)
Creatinine, Ser: 0.73 mg/dL (ref 0.57–1.00)
Globulin, Total: 2.7 g/dL (ref 1.5–4.5)
Glucose: 91 mg/dL (ref 70–99)
Potassium: 4.6 mmol/L (ref 3.5–5.2)
Sodium: 139 mmol/L (ref 134–144)
Total Protein: 7.3 g/dL (ref 6.0–8.5)
eGFR: 109 mL/min/{1.73_m2} (ref >59)

## 2022-07-22 LAB — LIPID PANEL
Chol/HDL Ratio: 3.6 ratio (ref 0.0–4.4)
Cholesterol, Total: 192 mg/dL (ref 100–199)
HDL: 54 mg/dL (ref >39)
LDL Chol Calc (NIH): 116 mg/dL — ABNORMAL HIGH (ref 0–99)
Triglycerides: 122 mg/dL (ref 0–149)
VLDL Cholesterol Cal: 22 mg/dL (ref 5–40)

## 2022-07-22 LAB — CBC WITH DIFFERENTIAL/PLATELET
Basophils Absolute: 0 10*3/uL (ref 0.0–0.2)
Basos: 0 %
EOS (ABSOLUTE): 0.1 10*3/uL (ref 0.0–0.4)
Eos: 3 %
Hematocrit: 41.8 % (ref 34.0–46.6)
Hemoglobin: 14.4 g/dL (ref 11.1–15.9)
Immature Grans (Abs): 0 10*3/uL (ref 0.0–0.1)
Immature Granulocytes: 0 %
Lymphocytes Absolute: 1.9 10*3/uL (ref 0.7–3.1)
Lymphs: 40 %
MCH: 31.6 pg (ref 26.6–33.0)
MCHC: 34.4 g/dL (ref 31.5–35.7)
MCV: 92 fL (ref 79–97)
Monocytes Absolute: 0.3 10*3/uL (ref 0.1–0.9)
Monocytes: 5 %
Neutrophils Absolute: 2.5 10*3/uL (ref 1.4–7.0)
Neutrophils: 52 %
Platelets: 325 10*3/uL (ref 150–450)
RBC: 4.55 x10E6/uL (ref 3.77–5.28)
RDW: 11.9 % (ref 11.7–15.4)
WBC: 4.8 10*3/uL (ref 3.4–10.8)

## 2022-07-22 LAB — HEMOGLOBIN A1C
Est. average glucose Bld gHb Est-mCnc: 100 mg/dL
Hgb A1c MFr Bld: 5.1 % (ref 4.8–5.6)

## 2022-07-22 LAB — TSH: TSH: 1.5 u[IU]/mL (ref 0.450–4.500)

## 2022-07-24 ENCOUNTER — Encounter: Payer: Self-pay | Admitting: Physician Assistant

## 2022-07-24 ENCOUNTER — Ambulatory Visit (INDEPENDENT_AMBULATORY_CARE_PROVIDER_SITE_OTHER): Payer: 59 | Admitting: Physician Assistant

## 2022-07-24 VITALS — BP 113/81 | HR 92 | Resp 18 | Ht 67.0 in | Wt 173.0 lb

## 2022-07-24 DIAGNOSIS — Z0000 Encounter for general adult medical examination without abnormal findings: Secondary | ICD-10-CM | POA: Diagnosis not present

## 2022-07-24 DIAGNOSIS — E7800 Pure hypercholesterolemia, unspecified: Secondary | ICD-10-CM | POA: Diagnosis not present

## 2022-07-24 NOTE — Patient Instructions (Signed)

## 2022-07-24 NOTE — Progress Notes (Signed)
Complete physical exam   Patient: Jacqueline Chen   DOB: 11/14/1985   36 y.o. Female  MRN: [id number] Visit Date: 07/24/2022   Chief Complaint  Patient presents with   Annual Exam   Subjective    Jacqueline Chen is a 36 y.o. female who presents today for a complete physical exam.  She reports consuming a low fat diet.  [place] 3-4 times per week.  She generally feels fairly well. She does not have additional problems to discuss today.     Past Medical History:  Diagnosis Date   Goiter 2015   multinodular goiter   Thyroid disease    [employer] 05/09/2020   History reviewed. No pertinent surgical history. Social History   Socioeconomic History   Marital status: Married    Spouse name: Not on file   Number of children: Not on file   Years of education: Not on file   Highest education level: Not on file  Occupational History   Not on file  Tobacco Use   Smoking status: Never   Smokeless tobacco: Never  Vaping Use   Vaping Use: Never used  Substance and Sexual Activity   Alcohol use: No    Comment: currently breast feeding but has 1 drink/wk when not breastfeeding   Drug use: No   Sexual activity: Yes    Birth control/protection: Pill  Other Topics Concern   Not on file  Social History Narrative   Not on file   Social Determinants of Health   Financial Resource Strain: Not on file  Food Insecurity: Not on file  Transportation Needs: Not on file  Physical Activity: Not on file  Stress: Not on file  Social Connections: Not on file  Intimate Partner Violence: Not on file     Medications: Outpatient Medications Prior to Visit  Medication Sig   cetirizine (ZYRTEC) 10 MG tablet Take 10 mg by mouth daily.   fluticasone (FLONASE) 50 MCG/ACT nasal spray Place 2 sprays into both nostrils daily.   amoxicillin-clavulanate (AUGMENTIN) 875-125 MG tablet Take 1 tablet by mouth 2 (two) times daily. (Patient not taking: Reported on 07/24/2022)   fluticasone (FLONASE) 50  MCG/ACT nasal spray PLACE 1 SPRAY INTO BOTH NOSTRILS DAILY.   levothyroxine (SYNTHROID) 25 MCG tablet Take 1 tablet (25 mcg total) by mouth daily before breakfast. (Patient not taking: Reported on 07/24/2022)   Multiple Vitamins-Minerals (MULTIVITAMIN ADULT) CHEW Chew 1 each by mouth daily. (Patient not taking: Reported on 07/24/2022)   Norethindrone Acetate-Ethinyl Estrad-FE (LOESTRIN 24 FE) 1-20 MG-MCG(24) tablet Take 1 tablet by mouth daily. (Patient not taking: Reported on 07/24/2022)   norethindrone-ethinyl estradiol-FE (LOESTRIN FE) 1-20 MG-MCG tablet TAKE 1 TABLET BY MOUTH ONCE DAILY   norethindrone-ethinyl estradiol-FE (LOESTRIN FE) 1-20 MG-MCG tablet TAKE 1 TABLET BY MOUTH ONCE DAILY (Patient not taking: Reported on 07/24/2022)   No facility-administered medications prior to visit.    Review of Systems Review of Systems:  A fourteen system review of systems was performed and found to be positive as per HPI.   Last CBC Lab Results  Component Value Date   WBC 4.8 07/21/2022   HGB 14.4 07/21/2022   HCT 41.8 07/21/2022   MCV 92 07/21/2022   MCH 31.6 07/21/2022   RDW 11.9 07/21/2022   PLT 325 60/06/9322   Last metabolic panel Lab Results  Component Value Date   GLUCOSE 91 07/21/2022   NA 139 07/21/2022   K 4.6 07/21/2022   CL 103 07/21/2022   CO2  22 07/21/2022   BUN 14 07/21/2022   CREATININE 0.73 07/21/2022   EGFR 109 07/21/2022   CALCIUM 9.6 07/21/2022   PROT 7.3 07/21/2022   ALBUMIN 4.6 07/21/2022   LABGLOB 2.7 07/21/2022   AGRATIO 1.7 07/21/2022   BILITOT 0.4 07/21/2022   ALKPHOS 49 07/21/2022   AST 20 07/21/2022   ALT 16 07/21/2022   Last lipids Lab Results  Component Value Date   CHOL 192 07/21/2022   HDL 54 07/21/2022   LDLCALC 116 (H) 07/21/2022   TRIG 122 07/21/2022   CHOLHDL 3.6 07/21/2022   Last hemoglobin A1c Lab Results  Component Value Date   HGBA1C 5.1 07/21/2022   Last thyroid functions Lab Results  Component Value Date   TSH 1.500  07/21/2022   Last vitamin D Lab Results  Component Value Date   VD25OH 35.3 08/20/2017    Objective    BP 113/81 (BP Location: Left Arm, Patient Position: Sitting, Cuff Size: Normal)   Pulse 92   Resp 18   Ht _0  (1.702 m)   Wt 173 lb (78.5 kg)   LMP 06/20/2022 (Exact Date)   SpO2 99%   BMI 27.10 kg/m  BP Readings from Last 3 Encounters:  07/24/22 113/81  05/24/21 103/70  11/09/20 101/61   Wt Readings from Last 3 Encounters:  07/24/22 173 lb (78.5 kg)  05/24/21 163 lb 12.8 oz (74.3 kg)  11/09/20 174 lb 12.8 oz (79.3 kg)    Physical Exam   General Appearance:       Alert, cooperative, in no acute distress, appears stated age   Head:    Normocephalic, without obvious abnormality, atraumatic  Eyes:    PERRL, conjunctiva/corneas clear, EOM's intact, fundi    benign, both eyes  Ears:    Normal TM's and external ear canals, both ears  Nose:   Nares normal, septum midline, mucosa normal, +clear drainage, no sinus tenderness  Throat:   Lips, mucosa, and tongue normal; teeth and gums normal  Neck:   Supple, symmetrical, trachea midline, no adenopathy;    thyroid:  +mild enlargement, no tenderness/nodules; no JVD  Back:     Symmetric, no curvature, ROM normal, no CVA tenderness  Lungs:     Clear to auscultation bilaterally, respirations unlabored  Chest Wall:    No tenderness or deformity   Heart:    Normal heart rate. Normal rhythm. No murmurs, rubs, or gallops.   Breast Exam:    deferred  Abdomen:     Soft, non-tender, bowel sounds active all four quadrants,    no masses, no organomegaly  Pelvic:    deferred  Extremities:   All extremities are intact. No cyanosis or edema  Pulses:   2+ and symmetric all extremities  Skin:   Skin color, texture, turgor normal, no rashes or suspicious lesions  Lymph nodes:   Cervical, supraclavicular nodes normal  Neurologic:   CNII-XII grossly intact.     Last depression screening scores    07/24/2022    8:24 AM 05/24/2021    8:34  AM 11/09/2020    8:22 AM  PHQ 2/9 Scores  PHQ - 2 Score 0 0 0  PHQ- 9 Score 0 0 0   Last fall risk screening    07/24/2022    8:40 AM  [place] in the past year? 0  Number falls in past yr: 0  Injury with Fall? 0     No results found for any visits on 07/24/22.  Assessment & Plan    Routine Health Maintenance and Physical Exam  Exercise Activities and Dietary recommendations -Continue heart healthy diet low in fat and carbohydrates. Continue moderate exercise 150 mins/wk.  Immunization History  Administered Date(s) Administered   Influenza-Unspecified 06/01/2019, 06/11/2020, 06/07/2021, 06/13/2022   PFIZER(Purple Top)SARS-COV-2 Vaccination 09/05/2019, 09/23/2019, 06/22/2020   Pneumococcal Polysaccharide-23 12/02/2010   Tdap 01/19/2014   Zoster Recombinat (Shingrix) 12/02/2010    Health Maintenance  Topic Date Due   Hepatitis C Screening  Never done   COVID-19 Vaccine (4 - Pfizer series) 08/17/2020   PAP SMEAR-Modifier  10/17/2023   TETANUS/TDAP  01/20/2024   INFLUENZA VACCINE  Completed   HIV Screening  Completed   HPV VACCINES  Aged Out    Discussed health benefits of physical activity, and encouraged her to engage in regular exercise appropriate for her age and condition.  Problem List Items Addressed This Visit       Other   Healthcare maintenance - Primary   Other Visit Diagnoses     Elevated LDL cholesterol level          Discussed with patient most recent lab results which are essentially within normal limits or stable from prior. LDL mildly elevated- recently returned from vacation. UTD pap, flu vaccine and Tdap. Continue good hydration. Follow-up in 1 yr for CPE and FBW.   Return in about 1 year (around 07/25/2023) for CPE and FBW.       Lorrene Reid, PA-C  [facility] at [facility] [phone] (phone) [phone] (fax)  [place]

## 2022-08-18 ENCOUNTER — Other Ambulatory Visit: Payer: Self-pay

## 2022-08-19 ENCOUNTER — Other Ambulatory Visit: Payer: Self-pay

## 2022-08-25 ENCOUNTER — Other Ambulatory Visit: Payer: Self-pay

## 2022-08-26 DIAGNOSIS — Z872 Personal history of diseases of the skin and subcutaneous tissue: Secondary | ICD-10-CM | POA: Diagnosis not present

## 2022-08-26 DIAGNOSIS — D220 Melanocytic nevi of lip: Secondary | ICD-10-CM | POA: Diagnosis not present

## 2022-08-26 DIAGNOSIS — L814 Other melanin hyperpigmentation: Secondary | ICD-10-CM | POA: Diagnosis not present

## 2022-08-26 DIAGNOSIS — D225 Melanocytic nevi of trunk: Secondary | ICD-10-CM | POA: Diagnosis not present

## 2022-08-26 DIAGNOSIS — L821 Other seborrheic keratosis: Secondary | ICD-10-CM | POA: Diagnosis not present

## 2022-09-09 ENCOUNTER — Other Ambulatory Visit: Payer: Self-pay

## 2022-09-25 ENCOUNTER — Other Ambulatory Visit: Payer: Self-pay

## 2022-09-25 ENCOUNTER — Telehealth: Payer: Commercial Managed Care - PPO | Admitting: Family Medicine

## 2022-09-25 DIAGNOSIS — B9689 Other specified bacterial agents as the cause of diseases classified elsewhere: Secondary | ICD-10-CM

## 2022-09-25 DIAGNOSIS — J019 Acute sinusitis, unspecified: Secondary | ICD-10-CM

## 2022-09-25 MED ORDER — DOXYCYCLINE HYCLATE 100 MG PO TABS
100.0000 mg | ORAL_TABLET | Freq: Two times a day (BID) | ORAL | 0 refills | Status: AC
  Filled 2022-09-25: qty 20, 10d supply, fill #0

## 2022-09-25 NOTE — Progress Notes (Signed)
E-Visit for Sinus Problems  We are sorry that you are not feeling well.  Here is how we plan to help!  Based on what you have shared with me it looks like you have sinusitis.  Sinusitis is inflammation and infection in the sinus cavities of the head.  Based on your presentation I believe you most likely have Acute Bacterial Sinusitis.  This is an infection caused by bacteria and is treated with antibiotics. I have prescribed Doxycycline '100mg'$  by mouth twice a day for 10 days.  Since you had Augmentin less than 3 months ago. You may use an oral decongestant such as Mucinex D or if you have glaucoma or high blood pressure use plain Mucinex. Saline nasal spray help and can safely be used as often as needed for congestion.  If you develop worsening sinus pain, fever or notice severe headache and vision changes, or if symptoms are not better after completion of antibiotic, please schedule an appointment with a health care provider.    Sinus infections are not as easily transmitted as other respiratory infection, however we still recommend that you avoid close contact with loved ones, especially the very young and elderly.  Remember to wash your hands thoroughly throughout the day as this is the number one way to prevent the spread of infection!  Home Care: Only take medications as instructed by your medical team. Complete the entire course of an antibiotic. Do not take these medications with alcohol. A steam or ultrasonic humidifier can help congestion.  You can place a towel over your head and breathe in the steam from hot water coming from a faucet. Avoid close contacts especially the very young and the elderly. Cover your mouth when you cough or sneeze. Always remember to wash your hands.  Get Help Right Away If: You develop worsening fever or sinus pain. You develop a severe head ache or visual changes. Your symptoms persist after you have completed your treatment plan.  Make sure  you Understand these instructions. Will watch your condition. Will get help right away if you are not doing well or get worse.  Thank you for choosing an e-visit.  Your e-visit answers were reviewed by a board certified advanced clinical practitioner to complete your personal care plan. Depending upon the condition, your plan could have included both over the counter or prescription medications.  Please review your pharmacy choice. Make sure the pharmacy is open so you can pick up prescription now. If there is a problem, you may contact your provider through [employer] and have the prescription routed to another pharmacy.  Your safety is important to [place]. If you have drug allergies check your prescription carefully.   For the next 24 hours you can use MyChart to ask questions about today's visit, request a non-urgent call back, or ask for a work or school excuse. You will get an email in the next two days asking about your experience. I hope that your e-visit has been valuable and will speed your recovery.   I provided 5 minutes of non face-to-face time during this encounter for chart review, medication and order placement, as well as and documentation.

## 2022-09-30 DIAGNOSIS — M9903 Segmental and somatic dysfunction of lumbar region: Secondary | ICD-10-CM | POA: Diagnosis not present

## 2022-09-30 DIAGNOSIS — R519 Headache, unspecified: Secondary | ICD-10-CM | POA: Diagnosis not present

## 2022-09-30 DIAGNOSIS — M5431 Sciatica, right side: Secondary | ICD-10-CM | POA: Diagnosis not present

## 2022-09-30 DIAGNOSIS — M9902 Segmental and somatic dysfunction of thoracic region: Secondary | ICD-10-CM | POA: Diagnosis not present

## 2022-09-30 DIAGNOSIS — M9901 Segmental and somatic dysfunction of cervical region: Secondary | ICD-10-CM | POA: Diagnosis not present

## 2022-10-28 DIAGNOSIS — M5431 Sciatica, right side: Secondary | ICD-10-CM | POA: Diagnosis not present

## 2022-10-28 DIAGNOSIS — M9902 Segmental and somatic dysfunction of thoracic region: Secondary | ICD-10-CM | POA: Diagnosis not present

## 2022-10-28 DIAGNOSIS — R519 Headache, unspecified: Secondary | ICD-10-CM | POA: Diagnosis not present

## 2022-10-28 DIAGNOSIS — M9903 Segmental and somatic dysfunction of lumbar region: Secondary | ICD-10-CM | POA: Diagnosis not present

## 2022-10-28 DIAGNOSIS — M9901 Segmental and somatic dysfunction of cervical region: Secondary | ICD-10-CM | POA: Diagnosis not present

## 2022-11-04 DIAGNOSIS — M5431 Sciatica, right side: Secondary | ICD-10-CM | POA: Diagnosis not present

## 2022-11-04 DIAGNOSIS — M9902 Segmental and somatic dysfunction of thoracic region: Secondary | ICD-10-CM | POA: Diagnosis not present

## 2022-11-04 DIAGNOSIS — M9903 Segmental and somatic dysfunction of lumbar region: Secondary | ICD-10-CM | POA: Diagnosis not present

## 2022-11-04 DIAGNOSIS — R519 Headache, unspecified: Secondary | ICD-10-CM | POA: Diagnosis not present

## 2022-11-04 DIAGNOSIS — M9901 Segmental and somatic dysfunction of cervical region: Secondary | ICD-10-CM | POA: Diagnosis not present

## 2022-11-05 DIAGNOSIS — M9901 Segmental and somatic dysfunction of cervical region: Secondary | ICD-10-CM | POA: Diagnosis not present

## 2022-11-05 DIAGNOSIS — M5431 Sciatica, right side: Secondary | ICD-10-CM | POA: Diagnosis not present

## 2022-11-05 DIAGNOSIS — M9903 Segmental and somatic dysfunction of lumbar region: Secondary | ICD-10-CM | POA: Diagnosis not present

## 2022-11-05 DIAGNOSIS — R519 Headache, unspecified: Secondary | ICD-10-CM | POA: Diagnosis not present

## 2022-11-05 DIAGNOSIS — M9902 Segmental and somatic dysfunction of thoracic region: Secondary | ICD-10-CM | POA: Diagnosis not present

## 2022-11-06 ENCOUNTER — Other Ambulatory Visit: Payer: Self-pay

## 2022-11-06 DIAGNOSIS — M542 Cervicalgia: Secondary | ICD-10-CM | POA: Diagnosis not present

## 2022-11-07 ENCOUNTER — Other Ambulatory Visit: Payer: Self-pay

## 2022-11-14 DIAGNOSIS — M542 Cervicalgia: Secondary | ICD-10-CM | POA: Diagnosis not present

## 2022-11-24 DIAGNOSIS — M542 Cervicalgia: Secondary | ICD-10-CM | POA: Diagnosis not present

## 2022-11-25 DIAGNOSIS — M9902 Segmental and somatic dysfunction of thoracic region: Secondary | ICD-10-CM | POA: Diagnosis not present

## 2022-11-25 DIAGNOSIS — M9903 Segmental and somatic dysfunction of lumbar region: Secondary | ICD-10-CM | POA: Diagnosis not present

## 2022-11-25 DIAGNOSIS — R519 Headache, unspecified: Secondary | ICD-10-CM | POA: Diagnosis not present

## 2022-11-25 DIAGNOSIS — M5431 Sciatica, right side: Secondary | ICD-10-CM | POA: Diagnosis not present

## 2022-11-25 DIAGNOSIS — M9901 Segmental and somatic dysfunction of cervical region: Secondary | ICD-10-CM | POA: Diagnosis not present

## 2022-11-30 ENCOUNTER — Other Ambulatory Visit: Payer: Self-pay

## 2022-12-03 ENCOUNTER — Other Ambulatory Visit: Payer: Self-pay

## 2022-12-03 ENCOUNTER — Telehealth: Payer: Self-pay

## 2022-12-03 NOTE — Telephone Encounter (Signed)
Pt is requesting a refill on: levothyroxine (SYNTHROID) 25 MCG tablet   Pharmacy: [facility]   LOV 07/24/22 ROV  07/27/23

## 2022-12-04 ENCOUNTER — Other Ambulatory Visit: Payer: Self-pay

## 2022-12-04 ENCOUNTER — Other Ambulatory Visit (HOSPITAL_COMMUNITY): Payer: Self-pay

## 2022-12-04 ENCOUNTER — Other Ambulatory Visit: Payer: Self-pay | Admitting: Obstetrics and Gynecology

## 2022-12-04 DIAGNOSIS — Z01419 Encounter for gynecological examination (general) (routine) without abnormal findings: Secondary | ICD-10-CM | POA: Diagnosis not present

## 2022-12-04 DIAGNOSIS — E079 Disorder of thyroid, unspecified: Secondary | ICD-10-CM | POA: Diagnosis not present

## 2022-12-04 DIAGNOSIS — Z760 Encounter for issue of repeat prescription: Secondary | ICD-10-CM | POA: Diagnosis not present

## 2022-12-04 DIAGNOSIS — E049 Nontoxic goiter, unspecified: Secondary | ICD-10-CM | POA: Diagnosis not present

## 2022-12-04 DIAGNOSIS — Z6827 Body mass index (BMI) 27.0-27.9, adult: Secondary | ICD-10-CM | POA: Diagnosis not present

## 2022-12-04 MED ORDER — LEVOTHYROXINE SODIUM 25 MCG PO TABS
25.0000 ug | ORAL_TABLET | Freq: Every day | ORAL | 2 refills | Status: DC
  Filled 2022-12-04: qty 72, 72d supply, fill #0
  Filled 2022-12-04: qty 90, 90d supply, fill #0
  Filled 2023-02-12: qty 72, 72d supply, fill #1
  Filled 2023-04-18: qty 72, 72d supply, fill #2
  Filled 2023-07-01: qty 72, 72d supply, fill #3

## 2022-12-04 MED ORDER — NORETHIN ACE-ETH ESTRAD-FE 1-20 MG-MCG PO TABS
1.0000 | ORAL_TABLET | Freq: Every day | ORAL | 4 refills | Status: DC
  Filled 2022-12-04 – 2023-01-29 (×2): qty 84, 84d supply, fill #0
  Filled 2023-04-18: qty 84, 84d supply, fill #1
  Filled 2023-07-12: qty 84, 84d supply, fill #2
  Filled 2023-10-04: qty 84, 84d supply, fill #3

## 2022-12-04 NOTE — Addendum Note (Signed)
Addended by: Gemma Payor on: 12/04/2022 08:58 AM   Modules accepted: Orders

## 2022-12-04 NOTE — Telephone Encounter (Signed)
Refill sent.

## 2022-12-08 ENCOUNTER — Other Ambulatory Visit (HOSPITAL_COMMUNITY): Payer: Self-pay

## 2022-12-15 ENCOUNTER — Ambulatory Visit
Admission: RE | Admit: 2022-12-15 | Discharge: 2022-12-15 | Disposition: A | Discharge status: Home / Self Care | Payer: Commercial Managed Care - PPO | Source: Ambulatory Visit | Attending: Obstetrics and Gynecology | Admitting: Obstetrics and Gynecology

## 2022-12-15 DIAGNOSIS — E049 Nontoxic goiter, unspecified: Secondary | ICD-10-CM | POA: Diagnosis not present

## 2022-12-23 DIAGNOSIS — M5431 Sciatica, right side: Secondary | ICD-10-CM | POA: Diagnosis not present

## 2022-12-23 DIAGNOSIS — M9903 Segmental and somatic dysfunction of lumbar region: Secondary | ICD-10-CM | POA: Diagnosis not present

## 2022-12-23 DIAGNOSIS — R519 Headache, unspecified: Secondary | ICD-10-CM | POA: Diagnosis not present

## 2022-12-23 DIAGNOSIS — M9902 Segmental and somatic dysfunction of thoracic region: Secondary | ICD-10-CM | POA: Diagnosis not present

## 2022-12-23 DIAGNOSIS — M9901 Segmental and somatic dysfunction of cervical region: Secondary | ICD-10-CM | POA: Diagnosis not present

## 2023-01-20 DIAGNOSIS — M5431 Sciatica, right side: Secondary | ICD-10-CM | POA: Diagnosis not present

## 2023-01-20 DIAGNOSIS — R519 Headache, unspecified: Secondary | ICD-10-CM | POA: Diagnosis not present

## 2023-01-20 DIAGNOSIS — M9901 Segmental and somatic dysfunction of cervical region: Secondary | ICD-10-CM | POA: Diagnosis not present

## 2023-01-20 DIAGNOSIS — M9903 Segmental and somatic dysfunction of lumbar region: Secondary | ICD-10-CM | POA: Diagnosis not present

## 2023-01-20 DIAGNOSIS — M9902 Segmental and somatic dysfunction of thoracic region: Secondary | ICD-10-CM | POA: Diagnosis not present

## 2023-01-22 ENCOUNTER — Other Ambulatory Visit (HOSPITAL_COMMUNITY): Payer: Self-pay

## 2023-01-22 ENCOUNTER — Other Ambulatory Visit: Payer: Self-pay

## 2023-01-22 MED ORDER — ONDANSETRON 4 MG PO TBDP
4.0000 mg | ORAL_TABLET | Freq: Four times a day (QID) | ORAL | 0 refills | Status: DC | PRN
  Filled 2023-01-22: qty 30, 8d supply, fill #0

## 2023-01-29 ENCOUNTER — Other Ambulatory Visit: Payer: Self-pay

## 2023-02-12 ENCOUNTER — Other Ambulatory Visit: Payer: Self-pay

## 2023-02-16 ENCOUNTER — Telehealth: Payer: Commercial Managed Care - PPO | Admitting: Physician Assistant

## 2023-02-16 ENCOUNTER — Other Ambulatory Visit: Payer: Self-pay

## 2023-02-16 DIAGNOSIS — K219 Gastro-esophageal reflux disease without esophagitis: Secondary | ICD-10-CM

## 2023-02-16 MED ORDER — OMEPRAZOLE 20 MG PO CPDR
20.0000 mg | DELAYED_RELEASE_CAPSULE | Freq: Every day | ORAL | 3 refills | Status: DC
Start: 2023-02-16 — End: 2023-03-05
  Filled 2023-02-16: qty 30, 30d supply, fill #0

## 2023-02-16 NOTE — Progress Notes (Signed)
I have spent 5 minutes in review of e-visit questionnaire, review and updating patient chart, medical decision making and response to patient.   Yalanda Soderman Cody Kalayah Leske, PA-C    

## 2023-02-16 NOTE — Progress Notes (Signed)
E-Visit for Heartburn  We are sorry that you are not feeling well.  Here is how we plan to help!  Based on what you shared with me it looks like you most likely have Gastroesophageal Reflux Disease (GERD)  Gastroesophageal reflux disease (GERD) happens when acid from your stomach flows up into the esophagus.  When acid comes in contact with the esophagus, the acid causes sorenss (inflammation) in the esophagus.  Over time, GERD may create small holes (ulcers) in the lining of the esophagus.  I have prescribed Omeprazole 20 mg one by mouth daily until you follow up with a provider. It is very important since this has been ongoing and worsening that you schedule an in-person evaluation with your PCP.   Your symptoms should improve in the next day or two.  You can use antacids as needed until symptoms resolve.  Call us if your heartburn worsens, you have trouble swallowing, weight loss, spitting up blood or recurrent vomiting.  Home Care: May include lifestyle changes such as weight loss, quitting smoking and alcohol consumption Avoid foods and drinks that make your symptoms worse, such as: Caffeine or alcoholic drinks Chocolate Peppermint or mint flavorings Garlic and onions Spicy foods Citrus fruits, such as oranges, lemons, or limes Tomato-based foods such as sauce, chili, salsa and pizza Fried and fatty foods Avoid lying down for 3 hours prior to your bedtime or prior to taking a nap Eat small, frequent meals instead of a large meals Wear loose-fitting clothing.  Do not wear anything tight around your waist that causes pressure on your stomach. Raise the head of your bed 6 to 8 inches with wood blocks to help you sleep.  Extra pillows will not help.  Seek Help Right Away If: You have pain in your arms, neck, jaw, teeth or back Your pain increases or changes in intensity or duration You develop nausea, vomiting or sweating (diaphoresis) You develop shortness of breath or you  faint Your vomit is green, yellow, black or looks like coffee grounds or blood Your stool is red, bloody or black  These symptoms could be signs of other problems, such as heart disease, gastric bleeding or esophageal bleeding.  Make sure you : Understand these instructions. Will watch your condition. Will get help right away if you are not doing well or get worse.  Your e-visit answers were reviewed by a board certified advanced clinical practitioner to complete your personal care plan.  Depending on the condition, your plan could have included both over the counter or prescription medications.  If there is a problem please reply  once you have received a response from your provider.  Your safety is important to [place].  If you have drug allergies check your prescription carefully.    You can use MyChart to ask questions about today's visit, request a non-urgent call back, or ask for a work or school excuse for 24 hours related to this e-Visit. If it has been greater than 24 hours you will need to follow up with your provider, or enter a new e-Visit to address those concerns.  You will get an e-mail in the next two days asking about your experience.  I hope that your e-visit has been valuable and will speed your recovery. Thank you for using e-visits.

## 2023-02-17 DIAGNOSIS — M9903 Segmental and somatic dysfunction of lumbar region: Secondary | ICD-10-CM | POA: Diagnosis not present

## 2023-02-17 DIAGNOSIS — R519 Headache, unspecified: Secondary | ICD-10-CM | POA: Diagnosis not present

## 2023-02-17 DIAGNOSIS — M9901 Segmental and somatic dysfunction of cervical region: Secondary | ICD-10-CM | POA: Diagnosis not present

## 2023-02-17 DIAGNOSIS — M5431 Sciatica, right side: Secondary | ICD-10-CM | POA: Diagnosis not present

## 2023-02-17 DIAGNOSIS — M9902 Segmental and somatic dysfunction of thoracic region: Secondary | ICD-10-CM | POA: Diagnosis not present

## 2023-03-03 ENCOUNTER — Ambulatory Visit: Payer: Commercial Managed Care - PPO | Admitting: Family Medicine

## 2023-03-05 ENCOUNTER — Ambulatory Visit: Payer: Commercial Managed Care - PPO | Admitting: Family Medicine

## 2023-03-05 ENCOUNTER — Encounter: Payer: Self-pay | Admitting: Family Medicine

## 2023-03-05 ENCOUNTER — Other Ambulatory Visit (HOSPITAL_COMMUNITY): Payer: Self-pay

## 2023-03-05 DIAGNOSIS — K219 Gastro-esophageal reflux disease without esophagitis: Secondary | ICD-10-CM | POA: Insufficient documentation

## 2023-03-05 MED ORDER — OMEPRAZOLE 20 MG PO CPDR
20.0000 mg | DELAYED_RELEASE_CAPSULE | Freq: Every day | ORAL | 0 refills | Status: DC
Start: 2023-03-05 — End: 2023-05-26
  Filled 2023-03-05 – 2023-03-12 (×4): qty 45, 45d supply, fill #0

## 2023-03-05 NOTE — Progress Notes (Signed)
   Established Patient Office Visit  Subjective   Patient ID: Jacqueline Chen, female    DOB: 07/28/1986  Age: 37 y.o. MRN: [id number]  Chief Complaint  Patient presents with   Gastroesophageal Reflux    HPI Jacqueline Chen is a 37 y.o. female presenting today for follow up of GERD.  She had a virtual visit on 02/16/2023 and based on presentation was diagnosed with GERD.  Prescription of omeprazole 20 mg daily was sent in until she could have an in person appointment.  Also recommended lifestyle modifications. Omeprazole has been very beneficial the last couple of weeks. She states that she has had reflux since high school which is typically well-controlled with Tums as needed and avoiding triggering foods/drinks.  It has been worse over the past couple of months, and a 2-week course of Prilosec did not completely get rid of recent symptoms back in March.  ROS Negative unless otherwise noted in HPI   Objective:     BP 111/79 (BP Location: Left Arm, Patient Position: Sitting, Cuff Size: Normal)   Pulse 87   Resp 18   Ht 1' (0.305 m)   Wt 166 lb (75.3 kg)   HC 67" (170.2 cm)   SpO2 98%   BMI 810.49 kg/m   Physical Exam Constitutional:      General: She is not in acute distress.    Appearance: Normal appearance.  HENT:     Head: Normocephalic and atraumatic.  Cardiovascular:     Rate and Rhythm: Normal rate and regular rhythm.     Heart sounds: No murmur heard.    No friction rub. No gallop.  Pulmonary:     Effort: Pulmonary effort is normal. No respiratory distress.     Breath sounds: No wheezing, rhonchi or rales.  Abdominal:     General: Abdomen is flat. Bowel sounds are normal. There is no distension.     Palpations: Abdomen is soft. There is no mass.     Tenderness: There is no abdominal tenderness. There is no guarding or rebound.     Hernia: No hernia is present.  Skin:    General: Skin is warm and dry.  Neurological:     Mental Status: She is alert and oriented to  person, place, and time.     Assessment & Plan:  Gastroesophageal reflux disease without esophagitis Assessment & Plan: Continue omeprazole 20 mg daily for the next 6 weeks for a total of 8 weeks in initial course, continue lifestyle changes.  After finishing course of omeprazole, can switch back to using Tums as needed for symptoms.  If further relief is needed, trial OTC famotidine twice daily.  If symptoms still refractory then patient will let me know and we may consider referral to gastroenterology for further investigation.  Patient verbalized understanding and is agreeable to this plan.  Orders: -     Omeprazole; Take 1 capsule (20 mg total) by mouth daily.  Dispense: 45 capsule; Refill: 0    Return if symptoms worsen or fail to improve.    Melida Quitter, PA

## 2023-03-05 NOTE — Patient Instructions (Addendum)
Moving Forward: Continue omeprazole (Prilosec) 20 mg daily for 6 more weeks After 6 weeks, discontinue Prilosec. After that point, use Tums as needed, continue avoiding trigger foods/drinks. If needed after that point, you can try OTC famotidine (Pepcid) twice daily. If symptoms continue to persist even after the 8 week total course of omeprazole, Tums as needed, famotidine twice daily, and lifestyle changes - let me know and we can further investigate what is causing the symptoms.  Other Home Care: May include lifestyle changes such as weight loss, quitting smoking and alcohol consumption Avoid foods and drinks that make your symptoms worse, such as: Caffeine or alcoholic drinks Chocolate Peppermint or mint flavorings Garlic and onions Spicy foods Citrus fruits, such as oranges, lemons, or limes Tomato-based foods such as sauce, chili, salsa and pizza Fried and fatty foods Avoid lying down for 3 hours prior to your bedtime or prior to taking a nap Eat small, frequent meals instead of a large meals Wear loose-fitting clothing.  Do not wear anything tight around your waist that causes pressure on your stomach. Raise the head of your bed 6 to 8 inches with wood blocks to help you sleep.  Extra pillows will not help.

## 2023-03-05 NOTE — Assessment & Plan Note (Signed)
Continue omeprazole 20 mg daily for the next 6 weeks for a total of 8 weeks in initial course, continue lifestyle changes.  After finishing course of omeprazole, can switch back to using Tums as needed for symptoms.  If further relief is needed, trial OTC famotidine twice daily.  If symptoms still refractory then patient will let me know and we may consider referral to gastroenterology for further investigation.  Patient verbalized understanding and is agreeable to this plan.

## 2023-03-09 ENCOUNTER — Other Ambulatory Visit: Payer: Self-pay

## 2023-03-10 ENCOUNTER — Other Ambulatory Visit: Payer: Self-pay

## 2023-03-12 ENCOUNTER — Other Ambulatory Visit: Payer: Self-pay

## 2023-03-24 DIAGNOSIS — R519 Headache, unspecified: Secondary | ICD-10-CM | POA: Diagnosis not present

## 2023-03-24 DIAGNOSIS — M9901 Segmental and somatic dysfunction of cervical region: Secondary | ICD-10-CM | POA: Diagnosis not present

## 2023-03-24 DIAGNOSIS — M9903 Segmental and somatic dysfunction of lumbar region: Secondary | ICD-10-CM | POA: Diagnosis not present

## 2023-03-24 DIAGNOSIS — M5431 Sciatica, right side: Secondary | ICD-10-CM | POA: Diagnosis not present

## 2023-03-24 DIAGNOSIS — M9902 Segmental and somatic dysfunction of thoracic region: Secondary | ICD-10-CM | POA: Diagnosis not present

## 2023-04-21 DIAGNOSIS — M5431 Sciatica, right side: Secondary | ICD-10-CM | POA: Diagnosis not present

## 2023-04-21 DIAGNOSIS — M9902 Segmental and somatic dysfunction of thoracic region: Secondary | ICD-10-CM | POA: Diagnosis not present

## 2023-04-21 DIAGNOSIS — M9903 Segmental and somatic dysfunction of lumbar region: Secondary | ICD-10-CM | POA: Diagnosis not present

## 2023-04-21 DIAGNOSIS — M9901 Segmental and somatic dysfunction of cervical region: Secondary | ICD-10-CM | POA: Diagnosis not present

## 2023-04-21 DIAGNOSIS — R519 Headache, unspecified: Secondary | ICD-10-CM | POA: Diagnosis not present

## 2023-05-05 ENCOUNTER — Encounter: Payer: Self-pay | Admitting: Family Medicine

## 2023-05-05 DIAGNOSIS — K219 Gastro-esophageal reflux disease without esophagitis: Secondary | ICD-10-CM

## 2023-05-08 ENCOUNTER — Telehealth: Payer: Self-pay

## 2023-05-08 NOTE — Telephone Encounter (Signed)
-----   Message from Shellia Cleverly sent at 05/08/2023  8:03 AM EDT ----- Can you please get this patient an appt with me for GERD. Ok to [employer] [facility].   Thanks.

## 2023-05-08 NOTE — Telephone Encounter (Signed)
Appointment is scheduled with patient on 05/26/23 at 2:40 pm. New patient paper work is mailed to patient's home address.

## 2023-05-26 ENCOUNTER — Ambulatory Visit: Payer: Commercial Managed Care - PPO | Admitting: Gastroenterology

## 2023-05-26 ENCOUNTER — Other Ambulatory Visit: Payer: Self-pay

## 2023-05-26 ENCOUNTER — Encounter: Payer: Self-pay | Admitting: Gastroenterology

## 2023-05-26 VITALS — BP 96/70 | HR 94 | Ht 67.0 in | Wt 164.2 lb

## 2023-05-26 DIAGNOSIS — R12 Heartburn: Secondary | ICD-10-CM

## 2023-05-26 DIAGNOSIS — K219 Gastro-esophageal reflux disease without esophagitis: Secondary | ICD-10-CM | POA: Diagnosis not present

## 2023-05-26 DIAGNOSIS — M5431 Sciatica, right side: Secondary | ICD-10-CM | POA: Diagnosis not present

## 2023-05-26 DIAGNOSIS — R1310 Dysphagia, unspecified: Secondary | ICD-10-CM | POA: Diagnosis not present

## 2023-05-26 DIAGNOSIS — M9902 Segmental and somatic dysfunction of thoracic region: Secondary | ICD-10-CM | POA: Diagnosis not present

## 2023-05-26 DIAGNOSIS — M9901 Segmental and somatic dysfunction of cervical region: Secondary | ICD-10-CM | POA: Diagnosis not present

## 2023-05-26 DIAGNOSIS — R1110 Vomiting, unspecified: Secondary | ICD-10-CM | POA: Diagnosis not present

## 2023-05-26 DIAGNOSIS — M9903 Segmental and somatic dysfunction of lumbar region: Secondary | ICD-10-CM | POA: Diagnosis not present

## 2023-05-26 DIAGNOSIS — R519 Headache, unspecified: Secondary | ICD-10-CM | POA: Diagnosis not present

## 2023-05-26 MED ORDER — OMEPRAZOLE 20 MG PO CPDR
20.0000 mg | DELAYED_RELEASE_CAPSULE | Freq: Every day | ORAL | 3 refills | Status: DC
Start: 2023-05-26 — End: 2024-05-10
  Filled 2023-05-26: qty 90, 90d supply, fill #0
  Filled 2023-08-19: qty 90, 90d supply, fill #1
  Filled 2023-11-17: qty 90, 90d supply, fill #2
  Filled 2024-02-15: qty 90, 90d supply, fill #3

## 2023-05-26 NOTE — Patient Instructions (Signed)
You have been scheduled for an endoscopy. Please follow written instructions given to you at your visit today.  If you use inhalers (even only as needed), please bring them with you on the day of your procedure.  If you take any of the following medications, they will need to be adjusted prior to your procedure:   DO NOT TAKE 7 DAYS PRIOR TO TEST- Trulicity (dulaglutide) Ozempic, Wegovy (semaglutide) Mounjaro (tirzepatide) Bydureon Bcise (exanatide extended release)  DO NOT TAKE 1 DAY PRIOR TO YOUR TEST Rybelsus (semaglutide) Adlyxin (lixisenatide) Victoza (liraglutide) Byetta (exanatide) ___________________________________________________________________________    We have sent the following medications to your pharmacy for you to pick up at your convenience: Omeprazole   Due to recent changes in healthcare laws, you may see the results of your imaging and laboratory studies on MyChart before your provider has had a chance to review them.  We understand that in some cases there may be results that are confusing or concerning to you. Not all laboratory results come back in the same time frame and the provider may be waiting for multiple results in order to interpret others.  Please give [place] 48 hours in order for your provider to thoroughly review all the results before contacting the office for clarification of your results.   _______________________________________________________  If your blood pressure at your visit was 140/90 or greater, please contact your primary care physician to follow up on this.  _______________________________________________________  If you are age 37 or older, your body mass index should be between 23-30. Your Body mass index is 25.73 kg/m. If this is out of the aforementioned range listed, please consider follow up with your Primary Care Provider.  If you are age 39 or younger, your body mass index should be between 19-25. Your Body mass index is 25.73  kg/m. If this is out of the aformentioned range listed, please consider follow up with your Primary Care Provider.   ________________________________________________________  The [facility] providers would like to encourage you to use [facility] to communicate with providers for non-urgent requests or questions.  Due to long hold times on the telephone, sending your provider a message by [facility] may be a faster and more efficient way to get a response.  Please allow 48 business hours for a response.  Please remember that this is for non-urgent requests.  _______________________________________________________  Thank you for choosing me and [facility].  Dr. Doristine Locks

## 2023-05-26 NOTE — Progress Notes (Signed)
Chief Complaint: Heartburn, reflux   Referring Provider:     Melida Quitter, PA   HPI:     Jacqueline Chen is a 37 y.o. female referred to the [facility]astroenterology Clinic for evaluation of reflux symptoms.  Longstanding history of heartburn since high school, generally controlled with Tums on demand and dietary modifications.  Had worsening of reflux during pregnancy x 2, treated with short course of Prilosec with improvement.  Symptoms started worsening in March of this year.  Increasing frequency of heartburn, regurgitation.  Does have intermittent solid food dysphagia, pointing to lower sternal border.  No odynophagia.  No previous EGD.  Started OTC Prilosec x 2 weeks with overall improvement, and noticed return of index symptoms after 2-week trial completed and off PPI.  She followed up with her PCM and was started on Prilosec 20 mg daily in June with overall improvement, but has since completed a total of 8 weeks, stopped PPI, and has noticed return of symptoms again.  Father with reflux, HH, and esophageal dilation.    Reviewed most recent labs from 07/2022: Normal CBC, CMP, A1c.  No recent abdominal or chest imaging for review.  Past Medical History:  Diagnosis Date   GERD (gastroesophageal reflux disease)    Goiter 2015   multinodular goiter   Thyroid disease    Phreesia 05/09/2020     History reviewed. No pertinent surgical history. Family History  Problem Relation Age of Onset   Healthy Mother    Heart attack Father    Hyperlipidemia Father    Hypertension Father    Healthy Sister    Healthy Brother    Prostate cancer Maternal Grandfather    Cancer Paternal Grandmother        breast   Healthy Daughter    Healthy Daughter    Esophageal cancer Neg Hx    Colon cancer Neg Hx    Social History   Tobacco Use   Smoking status: Never    Passive exposure: Never   Smokeless tobacco: Never  Vaping Use   Vaping status: Never Used  Substance Use  Topics   Alcohol use: Yes    Comment: occ   Drug use: No   Current Outpatient Medications  Medication Sig Dispense Refill   cetirizine (ZYRTEC) 10 MG tablet Take 10 mg by mouth daily.     levothyroxine (SYNTHROID) 25 MCG tablet Take 1 tablet (25 mcg total) by mouth daily before breakfast. 90 tablet 2   Multiple Vitamins-Minerals (ONE-A-DAY WOMENS PO) Take 1 tablet by mouth daily.     norethindrone-ethinyl estradiol-FE (LOESTRIN FE) 1-20 MG-MCG tablet TAKE 1 TABLET BY MOUTH ONCE DAILY 84 tablet 4   omeprazole (PRILOSEC) 20 MG capsule Take 1 capsule (20 mg total) by mouth daily. (Patient not taking: Reported on 05/26/2023) 45 capsule 0   No current facility-administered medications for this visit.   No Known Allergies   Review of Systems: All systems reviewed and negative except where noted in HPI.     Physical Exam:    Wt Readings from Last 3 Encounters:  05/26/23 164 lb 4 oz (74.5 kg)  03/05/23 166 lb (75.3 kg)  07/24/22 173 lb (78.5 kg)    BP 96/70   Pulse 94   Ht 5\' 7"  (1.702 m)   Wt 164 lb 4 oz (74.5 kg)   BMI 25.73 kg/m  Constitutional:  Pleasant, in no acute distress. Psychiatric: Normal mood and  affect. Behavior is normal. Neck supple. No cervical LAD. Cardiovascular: Normal rate, regular rhythm. No edema Pulmonary/chest: Effort normal and breath sounds normal. No wheezing, rales or rhonchi. Neurological: Alert and oriented to person place and time. Skin: Skin is warm and dry. No rashes noted.   ASSESSMENT AND PLAN;   1) GERD 2) Heartburn 3) Regurgitation 4) Dysphagia 36 year old female with longstanding history of reflux, previously generally well-controlled with Tums on demand and dietary modifications.  Worsening symptoms this year with associated intermittent solid food dysphagia.  Good response to trial of PPI, but immediate breakthrough symptoms when PPI trial completed.  Discussed pathophysiology of reflux at length today.  Discussed options for  ongoing medical management vs endoscopic evaluation and brief conversation on antireflux surgical options.  - Expedited EGD to evaluate for erosive esophagitis, peptic stricture, luminal narrowing, LES laxity, hiatal hernia - Esophageal dilation and/or biopsies as appropriate - Restart Prilosec 20 mg daily - Continue antireflux lifestyle/dietary modifications  The indications, risks, and benefits of EGD were explained to the patient in detail. Risks include but are not limited to bleeding, perforation, adverse reaction to medications, and cardiopulmonary compromise. Sequelae include but are not limited to the possibility of surgery, hospitalization, and mortality. The patient verbalized understanding and wished to proceed. All questions answered, referred to scheduler. Further recommendations pending results of the exam.     Shellia Cleverly, DO, FACG  05/26/2023, 2:48 PM   Jairo Ben Simmie Davies, PA

## 2023-06-08 ENCOUNTER — Encounter: Payer: Self-pay | Admitting: Gastroenterology

## 2023-06-08 ENCOUNTER — Ambulatory Visit: Payer: Commercial Managed Care - PPO | Admitting: Gastroenterology

## 2023-06-08 VITALS — BP 108/54 | HR 95 | Temp 97.9°F | Resp 16 | Ht 67.0 in | Wt 164.0 lb

## 2023-06-08 DIAGNOSIS — K319 Disease of stomach and duodenum, unspecified: Secondary | ICD-10-CM | POA: Diagnosis not present

## 2023-06-08 DIAGNOSIS — K2970 Gastritis, unspecified, without bleeding: Secondary | ICD-10-CM | POA: Diagnosis not present

## 2023-06-08 DIAGNOSIS — R1310 Dysphagia, unspecified: Secondary | ICD-10-CM

## 2023-06-08 DIAGNOSIS — K449 Diaphragmatic hernia without obstruction or gangrene: Secondary | ICD-10-CM

## 2023-06-08 DIAGNOSIS — R12 Heartburn: Secondary | ICD-10-CM

## 2023-06-08 DIAGNOSIS — K219 Gastro-esophageal reflux disease without esophagitis: Secondary | ICD-10-CM

## 2023-06-08 MED ORDER — SODIUM CHLORIDE 0.9 % IV SOLN
500.0000 mL | INTRAVENOUS | Status: DC
Start: 2023-06-08 — End: 2023-06-08

## 2023-06-08 NOTE — Progress Notes (Signed)
To pacu, VSS. Report to [email or web address].tb 

## 2023-06-08 NOTE — Progress Notes (Signed)
Pt's states no medical or surgical changes since previsit or office visit. 

## 2023-06-08 NOTE — Op Note (Signed)
[facility] Patient Name: Jacqueline Chen Procedure Date: 06/08/2023 11:38 AM MRN: [id number] Endoscopist: Doristine Locks , MD, [id number] Age: 37 Referring MD:  Date of Birth: 11/29/1985 Gender: Female Account #: [id number] Procedure:                Upper GI endoscopy Indications:              Dysphagia, Heartburn, Suspected esophageal reflux,                            Regurgitation Medicines:                Propofol per Anesthesia Procedure:                Pre-Anesthesia Assessment:                           - Prior to the procedure, a History and Physical                            was performed, and patient medications and                            allergies were reviewed. The patient's tolerance of                            previous anesthesia was also reviewed. The risks                            and benefits of the procedure and the sedation                            options and risks were discussed with the patient.                            All questions were answered, and informed consent                            was obtained. Prior Anticoagulants: The patient has                            taken no anticoagulant or antiplatelet agents. ASA                            Grade Assessment: I - A normal, healthy patient.                            After reviewing the risks and benefits, the patient                            was deemed in satisfactory condition to undergo the                            procedure.  After obtaining informed consent, the endoscope was                            passed under direct vision. Throughout the                            procedure, the patient's blood pressure, pulse, and                            oxygen saturations were monitored continuously. The                            Olympus Scope [id number] was introduced through the                            mouth, and advanced to the second part of  duodenum.                            The upper GI endoscopy was accomplished without                            difficulty. The patient tolerated the procedure                            well. Scope In: Scope Out: Findings:                 A single area of ectopic gastric mucosa was found                            in the upper third of the esophagus, 16 cm from the                            incisors.                           The Z-line was regular and was found 37 cm from the                            incisors.                           A 1-2 cm sliding type hiatal hernia was present.                           The gastroesophageal flap valve was visualized                            endoscopically and classified as Hill Grade III                            (minimal fold, loose to endoscope, hiatal hernia                            likely).  The esophagus was otherwise normal appearing. A TTS                            dilator was passed through the scope. Dilation with                            an 18-19-20 mm balloon dilator was performed to 20                            mm at the GEJ initially. The dilation site was                            examined and showed no bleeding, mucosal tear or                            perforation. The balloon was reinflated to 20 mm                            and retracted proximally through the esophagus for                            further empiric dilation of the esophagus. Biopsies                            were then obtained from the proximal and distal                            esophagus with cold forceps for histology and to                            rule out eosinophilic esophagitis. Estimated blood                            loss was minimal.                           Diffuse mild inflammation characterized by                            congestion (edema) and erythema was found in the                             gastric fundus and in the gastric body. Biopsies                            were taken with a cold forceps for Helicobacter                            pylori testing. Estimated blood loss was minimal.                           The gastric antrum, prepyloric region of the  stomach and pylorus were normal.                           The examined duodenum was normal. Complications:            No immediate complications. Estimated Blood Loss:     Estimated blood loss was minimal. Impression:               - Ectopic gastric mucosa in the upper third of the                            esophagus.                           - Z-line regular, 37 cm from the incisors.                           - 2 cm hiatal hernia.                           - Gastroesophageal flap valve classified as Hill                            Grade III (minimal fold, loose to endoscope, hiatal                            hernia likely).                           - No clear stricture, luminal narrowing, or                            esophageal ring noted. Dilated with a 20 mm TTS                            balloon. Biopsies were taken with a cold forceps                            for evaluation of eosinophilic esophagitis.                           - Mild non-ulcer gastritis. Biopsied.                           - Normal antrum, prepyloric region of the stomach                            and pylorus.                           - Normal examined duodenum. Recommendation:           - Patient has a contact number available for                            emergencies. The signs and symptoms of potential  delayed complications were discussed with the                            patient. Return to normal activities tomorrow.                            Written discharge instructions were provided to the                            patient.                           - Resume previous  diet.                           - Continue present medications.                           - Await pathology results.                           - Follow-up with me in the GI clinic at appointment                            to be scheduled. Doristine Locks, MD 06/08/2023 12:15:00 PM

## 2023-06-08 NOTE — Progress Notes (Signed)
Called to room to assist during endoscopic procedure.  Patient ID and intended procedure confirmed with present staff. Received instructions for my participation in the procedure from the performing physician.  

## 2023-06-08 NOTE — Progress Notes (Signed)
GASTROENTEROLOGY PROCEDURE H&P NOTE   Primary Care Physician: Melida Quitter, PA    Reason for Procedure:   Heartburn, reflux, dysphagia  Plan:    EGD  Patient is appropriate for endoscopic procedure(s) in the ambulatory ([facility]) setting.  The nature of the procedure, as well as the risks, benefits, and alternatives were carefully and thoroughly reviewed with the patient. Ample time for discussion and questions allowed. The patient understood, was satisfied, and agreed to proceed.     HPI: Jacqueline Chen is a 37 y.o. female who presents for EGD for evaluation of heartburn, reflux, and intermittent solid food dysphagia.  Patient was most recently seen in the Gastroenterology Clinic on 05/26/2023 by me.  No interval change in medical history since that appointment. Please refer to that note for full details regarding GI history and clinical presentation.   Past Medical History:  Diagnosis Date   GERD (gastroesophageal reflux disease)    Goiter 2015   multinodular goiter   Thyroid disease    [employer]reesia 05/09/2020    History reviewed. No pertinent surgical history.  Prior to Admission medications   Medication Sig Start Date End Date Taking? Authorizing Provider  cetirizine (ZYRTEC) 10 MG tablet Take 10 mg by mouth daily.   Yes [provider]  levothyroxine (SYNTHROID) 25 MCG tablet Take 1 tablet (25 mcg total) by mouth daily before breakfast. 12/04/22  Yes Saralyn Pilar A, PA  Multiple Vitamins-Minerals (ONE-A-DAY WOMENS PO) Take 1 tablet by mouth daily.   Yes [provider]  norethindrone-ethinyl estradiol-FE (LOESTRIN FE) 1-20 MG-MCG tablet TAKE 1 TABLET BY MOUTH ONCE DAILY 12/04/22  Yes   omeprazole (PRILOSEC) 20 MG capsule Take 1 capsule (20 mg total) by mouth daily. 05/26/23  Yes Jasn Xia, Verlin Dike, DO    Current Outpatient Medications  Medication Sig Dispense Refill   cetirizine (ZYRTEC) 10 MG tablet Take 10 mg by mouth daily.     levothyroxine  (SYNTHROID) 25 MCG tablet Take 1 tablet (25 mcg total) by mouth daily before breakfast. 90 tablet 2   Multiple Vitamins-Minerals (ONE-A-DAY WOMENS PO) Take 1 tablet by mouth daily.     norethindrone-ethinyl estradiol-FE (LOESTRIN FE) 1-20 MG-MCG tablet TAKE 1 TABLET BY MOUTH ONCE DAILY 84 tablet 4   omeprazole (PRILOSEC) 20 MG capsule Take 1 capsule (20 mg total) by mouth daily. 90 capsule 3   Current Facility-Administered Medications  Medication Dose Route Frequency Provider Last Rate Last Admin   0.9 %  sodium chloride infusion  500 mL Intravenous Continuous Laporsche Hoeger V, DO        Allergies as of 06/08/2023   (No Known Allergies)    Family History  Problem Relation Age of Onset   Healthy Mother    Heart attack Father    Hyperlipidemia Father    Hypertension Father    Healthy Sister    Healthy Brother    Prostate cancer Maternal Grandfather    Cancer Paternal Grandmother        breast   Healthy Daughter    Healthy Daughter    Esophageal cancer Neg Hx    Colon cancer Neg Hx     Social History   Socioeconomic History   Marital status: Married    Spouse name: Not on file   Number of children: Not on file   Years of education: Not on file   Highest education level: Not on file  Occupational History   Not on file  Tobacco Use   Smoking status:  Never    Passive exposure: Never   Smokeless tobacco: Never  Vaping Use   Vaping status: Never Used  Substance and Sexual Activity   Alcohol use: Yes    Comment: occ   Drug use: No   Sexual activity: Yes    Birth control/protection: Pill  Other Topics Concern   Not on file  Social History Narrative   Not on file   Social Determinants of Health   Financial Resource Strain: Not on file  Food Insecurity: Not on file  Transportation Needs: Not on file  Physical Activity: Not on file  Stress: Not on file  Social Connections: Not on file  Intimate Partner Violence: Not on file    Physical Exam: Vital signs in  last 24 hours: @BP  120/66   Pulse 98   Temp 97.9 F (36.6 C)   Ht 5\' 7"  (1.702 m)   Wt 164 lb (74.4 kg)   LMP 05/25/2023   SpO2 100%   BMI 25.69 kg/m  GEN: NAD EYE: Sclerae anicteric ENT: MMM CV: Non-tachycardic Pulm: CTA b/l GI: Soft, NT/ND NEURO:  Alert & Oriented x 3   Doristine Locks, DO [facility]   06/08/2023 11:44 AM

## 2023-06-08 NOTE — Patient Instructions (Addendum)
-  Await pathology results. -Handout on gastritis provided   YOU HAD AN ENDOSCOPIC PROCEDURE TODAY AT THE [facility]:   Refer to the procedure report that was given to you for any specific questions about what was found during the examination.  If the procedure report does not answer your questions, please call your gastroenterologist to clarify.  If you requested that your care partner not be given the details of your procedure findings, then the procedure report has been included in a sealed envelope for you to review at your convenience later.  YOU SHOULD EXPECT: Some feelings of bloating in the abdomen. Passage of more gas than usual.  Walking can help get rid of the air that was put into your GI tract during the procedure and reduce the bloating. If you had a lower endoscopy (such as a colonoscopy or flexible sigmoidoscopy) you may notice spotting of blood in your stool or on the toilet paper. If you underwent a bowel prep for your procedure, you may not have a normal bowel movement for a few days.  Please Note:  You might notice some irritation and congestion in your nose or some drainage.  This is from the oxygen used during your procedure.  There is no need for concern and it should clear up in a day or so.  SYMPTOMS TO REPORT IMMEDIATELY:  Following upper endoscopy (EGD)  Vomiting of blood or coffee ground material  New chest pain or pain under the shoulder blades  Painful or persistently difficult swallowing  New shortness of breath  Fever of 100F or higher  Black, tarry-looking stools  For urgent or emergent issues, a gastroenterologist can be reached at any hour by calling [phone]. Do not use MyChart messaging for urgent concerns.    DIET:  We do recommend a small meal at first, but then you may proceed to your regular diet.  Drink plenty of fluids but you should avoid alcoholic beverages for 24 hours.  ACTIVITY:  You should plan to take it easy for the rest  of today and you should NOT DRIVE or use heavy machinery until tomorrow (because of the sedation medicines used during the test).    FOLLOW UP: Our staff will call the number listed on your records the next business day following your procedure.  We will call around 7:15- 8:00 am to check on you and address any questions or concerns that you may have regarding the information given to you following your procedure. If we do not reach you, we will leave a message.     If any biopsies were taken you will be contacted by phone or by letter within the next 1-3 weeks.  Please call us at [phone] if you have not heard about the biopsies in 3 weeks.    SIGNATURES/CONFIDENTIALITY: You and/or your care partner have signed paperwork which will be entered into your electronic medical record.  These signatures attest to the fact that that the information above on your After Visit Summary has been reviewed and is understood.  Full responsibility of the confidentiality of this discharge information lies with you and/or your care-partner.

## 2023-06-09 ENCOUNTER — Telehealth: Payer: Self-pay

## 2023-06-09 NOTE — Telephone Encounter (Signed)
  Follow up Call-     06/08/2023   10:55 AM  Call back number  Post procedure Call Back phone  # [phone]  Permission to leave phone message Yes     Patient questions:  Do you have a fever, pain , or abdominal swelling? No. Pain Score  0 *  Have you tolerated food without any problems? Yes.    Have you been able to return to your normal activities? Yes.    Do you have any questions about your discharge instructions: Diet   No. Medications  No. Follow up visit  No.  Do you have questions or concerns about your Care? No.  Actions: * If pain score is 4 or above: No action needed, pain <4.

## 2023-06-10 LAB — SURGICAL PATHOLOGY

## 2023-06-12 ENCOUNTER — Other Ambulatory Visit: Payer: Self-pay

## 2023-06-12 MED ORDER — INFLUENZA VIRUS VACC SPLIT PF (FLUZONE) 0.5 ML IM SUSY
0.5000 mL | PREFILLED_SYRINGE | Freq: Once | INTRAMUSCULAR | 0 refills | Status: AC
  Filled 2023-06-12: qty 0.5, 1d supply, fill #0

## 2023-06-15 DIAGNOSIS — M9903 Segmental and somatic dysfunction of lumbar region: Secondary | ICD-10-CM | POA: Diagnosis not present

## 2023-06-15 DIAGNOSIS — M9901 Segmental and somatic dysfunction of cervical region: Secondary | ICD-10-CM | POA: Diagnosis not present

## 2023-06-15 DIAGNOSIS — M9902 Segmental and somatic dysfunction of thoracic region: Secondary | ICD-10-CM | POA: Diagnosis not present

## 2023-06-15 DIAGNOSIS — R519 Headache, unspecified: Secondary | ICD-10-CM | POA: Diagnosis not present

## 2023-06-15 DIAGNOSIS — M9905 Segmental and somatic dysfunction of pelvic region: Secondary | ICD-10-CM | POA: Diagnosis not present

## 2023-06-18 ENCOUNTER — Other Ambulatory Visit: Payer: Self-pay

## 2023-07-01 ENCOUNTER — Other Ambulatory Visit: Payer: Self-pay

## 2023-07-01 ENCOUNTER — Other Ambulatory Visit: Payer: Self-pay | Admitting: Family Medicine

## 2023-07-01 MED ORDER — LEVOTHYROXINE SODIUM 25 MCG PO TABS
25.0000 ug | ORAL_TABLET | Freq: Every day | ORAL | 0 refills | Status: DC
  Filled 2023-07-01 – 2023-07-03 (×2): qty 30, 30d supply, fill #0

## 2023-07-03 ENCOUNTER — Other Ambulatory Visit: Payer: Self-pay

## 2023-07-10 ENCOUNTER — Other Ambulatory Visit: Payer: Self-pay

## 2023-07-13 DIAGNOSIS — M9903 Segmental and somatic dysfunction of lumbar region: Secondary | ICD-10-CM | POA: Diagnosis not present

## 2023-07-13 DIAGNOSIS — M9902 Segmental and somatic dysfunction of thoracic region: Secondary | ICD-10-CM | POA: Diagnosis not present

## 2023-07-13 DIAGNOSIS — R519 Headache, unspecified: Secondary | ICD-10-CM | POA: Diagnosis not present

## 2023-07-13 DIAGNOSIS — M9905 Segmental and somatic dysfunction of pelvic region: Secondary | ICD-10-CM | POA: Diagnosis not present

## 2023-07-13 DIAGNOSIS — M9901 Segmental and somatic dysfunction of cervical region: Secondary | ICD-10-CM | POA: Diagnosis not present

## 2023-07-14 ENCOUNTER — Other Ambulatory Visit: Payer: Self-pay

## 2023-07-15 ENCOUNTER — Other Ambulatory Visit: Payer: Self-pay

## 2023-07-15 DIAGNOSIS — E7800 Pure hypercholesterolemia, unspecified: Secondary | ICD-10-CM

## 2023-07-23 ENCOUNTER — Other Ambulatory Visit: Payer: Commercial Managed Care - PPO

## 2023-07-23 DIAGNOSIS — E7800 Pure hypercholesterolemia, unspecified: Secondary | ICD-10-CM

## 2023-07-24 LAB — LIPID PANEL
Chol/HDL Ratio: 3 ratio (ref 0.0–4.4)
Cholesterol, Total: 212 mg/dL — ABNORMAL HIGH (ref 100–199)
HDL: 71 mg/dL (ref >39)
LDL Chol Calc (NIH): 124 mg/dL — ABNORMAL HIGH (ref 0–99)
Triglycerides: 99 mg/dL (ref 0–149)
VLDL Cholesterol Cal: 17 mg/dL (ref 5–40)

## 2023-07-24 LAB — COMPREHENSIVE METABOLIC PANEL
ALT: 16 [IU]/L (ref 0–32)
AST: 21 [IU]/L (ref 0–40)
Albumin: 4.4 g/dL (ref 3.9–4.9)
Alkaline Phosphatase: 50 [IU]/L (ref 44–121)
BUN/Creatinine Ratio: 12 (ref 9–23)
BUN: 10 mg/dL (ref 6–20)
Bilirubin Total: 0.3 mg/dL (ref 0.0–1.2)
CO2: 22 mmol/L (ref 20–29)
Calcium: 9.7 mg/dL (ref 8.7–10.2)
Chloride: 103 mmol/L (ref 96–106)
Creatinine, Ser: 0.81 mg/dL (ref 0.57–1.00)
Globulin, Total: 2.6 g/dL (ref 1.5–4.5)
Glucose: 82 mg/dL (ref 70–99)
Potassium: 5 mmol/L (ref 3.5–5.2)
Sodium: 138 mmol/L (ref 134–144)
Total Protein: 7 g/dL (ref 6.0–8.5)
eGFR: 96 mL/min/{1.73_m2} (ref >59)

## 2023-07-24 LAB — CBC WITH DIFFERENTIAL/PLATELET
Basophils Absolute: 0 10*3/uL (ref 0.0–0.2)
Basos: 0 %
EOS (ABSOLUTE): 0.1 10*3/uL (ref 0.0–0.4)
Eos: 2 %
Hematocrit: 43.1 % (ref 34.0–46.6)
Hemoglobin: 13.9 g/dL (ref 11.1–15.9)
Immature Grans (Abs): 0 10*3/uL (ref 0.0–0.1)
Immature Granulocytes: 0 %
Lymphocytes Absolute: 1.9 10*3/uL (ref 0.7–3.1)
Lymphs: 37 %
MCH: 30.6 pg (ref 26.6–33.0)
MCHC: 32.3 g/dL (ref 31.5–35.7)
MCV: 95 fL (ref 79–97)
Monocytes Absolute: 0.3 10*3/uL (ref 0.1–0.9)
Monocytes: 6 %
Neutrophils Absolute: 2.7 10*3/uL (ref 1.4–7.0)
Neutrophils: 55 %
Platelets: 339 10*3/uL (ref 150–450)
RBC: 4.54 x10E6/uL (ref 3.77–5.28)
RDW: 12.4 % (ref 11.7–15.4)
WBC: 5 10*3/uL (ref 3.4–10.8)

## 2023-07-24 LAB — HEMOGLOBIN A1C
Est. average glucose Bld gHb Est-mCnc: 100 mg/dL
Hgb A1c MFr Bld: 5.1 % (ref 4.8–5.6)

## 2023-07-27 ENCOUNTER — Other Ambulatory Visit: Payer: Self-pay

## 2023-07-27 ENCOUNTER — Encounter: Payer: Self-pay | Admitting: Family Medicine

## 2023-07-27 ENCOUNTER — Ambulatory Visit (INDEPENDENT_AMBULATORY_CARE_PROVIDER_SITE_OTHER): Payer: Commercial Managed Care - PPO | Admitting: Family Medicine

## 2023-07-27 VITALS — BP 107/70 | HR 93 | Resp 18 | Ht 67.0 in | Wt 165.0 lb

## 2023-07-27 DIAGNOSIS — E7800 Pure hypercholesterolemia, unspecified: Secondary | ICD-10-CM | POA: Insufficient documentation

## 2023-07-27 DIAGNOSIS — E039 Hypothyroidism, unspecified: Secondary | ICD-10-CM

## 2023-07-27 DIAGNOSIS — Z0000 Encounter for general adult medical examination without abnormal findings: Secondary | ICD-10-CM | POA: Diagnosis not present

## 2023-07-27 DIAGNOSIS — Z1159 Encounter for screening for other viral diseases: Secondary | ICD-10-CM

## 2023-07-27 DIAGNOSIS — Z6825 Body mass index (BMI) 25.0-25.9, adult: Secondary | ICD-10-CM

## 2023-07-27 DIAGNOSIS — E663 Overweight: Secondary | ICD-10-CM

## 2023-07-27 MED ORDER — LEVOTHYROXINE SODIUM 25 MCG PO TABS
25.0000 ug | ORAL_TABLET | Freq: Every day | ORAL | 3 refills | Status: DC
  Filled 2023-07-27: qty 90, 90d supply, fill #0
  Filled 2023-10-28: qty 90, 90d supply, fill #1
  Filled 2024-01-24: qty 90, 90d supply, fill #2
  Filled 2024-04-21: qty 90, 90d supply, fill #3

## 2023-07-27 NOTE — Assessment & Plan Note (Signed)
TSH within normal limits.  Continue levothyroxine 25 mcg daily each morning.  Will continue to monitor.

## 2023-07-27 NOTE — Progress Notes (Signed)
Complete physical exam  Patient: Jacqueline Chen   DOB: [birth year]   37 y.o. Female  MRN: [id number]  Subjective:    Chief Complaint  Patient presents with   Annual Exam    Jacqueline Chen is a 37 y.o. female who presents today for a complete physical exam. She reports consuming a  high-protein, low calorie  diet.  She stays active at work and with her children, but she does still find time to herself and enjoys walking and traveling.  She generally feels well. She does not have additional problems to discuss today.    Most recent fall risk assessment:    07/27/2023    8:18 AM  Fall Risk   Falls in the past year? 0  Number falls in past yr: 0  Injury with Fall? 0  Risk for fall due to : No Fall Risks  Follow up Falls evaluation completed     Most recent depression and anxiety screenings:    07/27/2023    8:18 AM 07/24/2022    8:24 AM  PHQ 2/9 Scores  PHQ - 2 Score 0 0  PHQ- 9 Score 0 0      07/27/2023    8:18 AM 07/24/2022    8:25 AM 05/24/2021    8:34 AM  GAD 7 : Generalized Anxiety Score  Nervous, Anxious, on Edge 0 0 0  Control/stop worrying 0  0  Worry too much - different things 0 0 0  Trouble relaxing 0 0 0  Restless 0 0 0  Easily annoyed or irritable 0  0  Afraid - awful might happen 0 0 0  Total GAD 7 Score 0  0  Anxiety Difficulty Not difficult at all Not difficult at all Not difficult at all    Patient Active Problem List   Diagnosis Date Noted   Hypothyroidism 07/27/2023   Elevated LDL cholesterol level 07/27/2023   GERD (gastroesophageal reflux disease) 03/05/2023    History reviewed. No pertinent surgical history. Social History   Tobacco Use   Smoking status: Never    Passive exposure: Never   Smokeless tobacco: Never  Vaping Use   Vaping status: Never Used  Substance Use Topics   Alcohol use: Yes    Comment: occ   Drug use: No   Family History  Problem Relation Age of Onset   Healthy Mother    Heart attack Father    Hyperlipidemia  Father    Hypertension Father    Heart disease Father    Healthy Sister    Healthy Brother    Prostate cancer Maternal Grandfather    Cancer Paternal Grandmother        breast   Healthy Daughter    Healthy Daughter    Esophageal cancer Neg Hx    Colon cancer Neg Hx    No Known Allergies   Patient Care Team: Melida Quitter, PA as PCP - General (Family Medicine) Candice Camp, MD as Consulting Physician (Obstetrics and Gynecology) Cherlyn Roberts, MD as Consulting Physician (Dermatology)   Outpatient Medications Prior to Visit  Medication Sig   cetirizine (ZYRTEC) 10 MG tablet Take 10 mg by mouth daily.   levothyroxine (SYNTHROID) 25 MCG tablet Take 1 tablet (25 mcg total) by mouth daily before breakfast.   Multiple Vitamins-Minerals (ONE-A-DAY WOMENS PO) Take 1 tablet by mouth daily.   norethindrone-ethinyl estradiol-FE (LOESTRIN FE) 1-20 MG-MCG tablet TAKE 1 TABLET BY MOUTH ONCE DAILY   omeprazole (PRILOSEC) 20 MG capsule Take  1 capsule (20 mg total) by mouth daily.   No facility-administered medications prior to visit.    Review of Systems  Constitutional:  Negative for chills, fever and malaise/fatigue.  HENT:  Negative for congestion and hearing loss.   Eyes:  Negative for blurred vision and double vision.  Respiratory:  Negative for cough and shortness of breath.   Cardiovascular:  Negative for chest pain, palpitations and leg swelling.  Gastrointestinal:  Negative for abdominal pain, constipation, diarrhea and heartburn.  Genitourinary:  Negative for frequency and urgency.  Musculoskeletal:  Negative for myalgias and neck pain.  Neurological:  Negative for headaches.  Endo/Heme/Allergies:  Negative for polydipsia.  Psychiatric/Behavioral:  Negative for depression. The patient is not nervous/anxious.       Objective:    BP 107/70 (BP Location: Left Arm, Patient Position: Sitting, Cuff Size: Normal)   Pulse 93   Resp 18   Ht 5\' 7"  (1.702 m)   Wt 165 lb (74.8  kg)   LMP 07/20/2023 (Exact Date)   SpO2 99%   BMI 25.84 kg/m    Physical Exam Constitutional:      General: She is not in acute distress.    Appearance: Normal appearance.  HENT:     Head: Normocephalic and atraumatic.     Right Ear: Tympanic membrane, ear canal and external ear normal.     Left Ear: Tympanic membrane, ear canal and external ear normal.     Nose: Nose normal.     Mouth/Throat:     Mouth: Mucous membranes are moist.     Pharynx: No oropharyngeal exudate or posterior oropharyngeal erythema.     Comments: Large tonsils (baseline), no erythema Eyes:     Extraocular Movements: Extraocular movements intact.     Conjunctiva/sclera: Conjunctivae normal.     Pupils: Pupils are equal, round, and reactive to light.     Comments: Does not wear glasses or contacts  Neck:     Thyroid: No thyroid mass, thyromegaly or thyroid tenderness.  Cardiovascular:     Rate and Rhythm: Normal rate and regular rhythm.     Heart sounds: Normal heart sounds. No murmur heard.    No friction rub. No gallop.  Pulmonary:     Effort: Pulmonary effort is normal. No respiratory distress.     Breath sounds: Normal breath sounds. No wheezing, rhonchi or rales.  Abdominal:     General: Abdomen is flat. Bowel sounds are normal. There is no distension.     Palpations: There is no mass.     Tenderness: There is no abdominal tenderness. There is no guarding.  Musculoskeletal:        General: Normal range of motion.     Cervical back: Normal range of motion and neck supple.  Lymphadenopathy:     Cervical: No cervical adenopathy.  Skin:    General: Skin is warm and dry.  Neurological:     Mental Status: She is alert and oriented to person, place, and time.     Cranial Nerves: No cranial nerve deficit.     Motor: No weakness.     Deep Tendon Reflexes: Reflexes normal.  Psychiatric:        Mood and Affect: Mood normal.       Assessment & Plan:    Routine Health Maintenance and Physical  Exam  Immunization History  Administered Date(s) Administered   Influenza, Seasonal, Injecte, Preservative Fre 06/12/2023   Influenza-Unspecified 06/01/2019, 06/11/2020, 06/07/2021, 06/13/2022   PFIZER(Purple Top)SARS-COV-2 Vaccination 09/05/2019,  09/23/2019, 06/22/2020   Pneumococcal Polysaccharide-23 12/02/2010   Tdap 01/19/2014   Zoster Recombinant(Shingrix) 12/02/2010    Health Maintenance  Topic Date Due   Hepatitis C Screening  Never done   COVID-19 Vaccine (4 - 2023-24 season) 08/12/2023 (Originally 05/17/2023)   Cervical Cancer Screening (HPV/Pap Cotest)  10/17/2023   DTaP/Tdap/Td (2 - Td or Tdap) 01/20/2024   INFLUENZA VACCINE  Completed   HIV Screening  Completed   HPV VACCINES  Aged Out    Reviewed most recent labs including CBC, CMP, lipid panel, A1C, TSH, and vitamin D. All within normal limits/stable from last check other than LDL elevated slightly to 124.  Discussed health benefits of physical activity, and encouraged her to engage in regular exercise appropriate for her age and condition.  Wellness examination  Hypothyroidism, unspecified type Assessment & Plan: TSH within normal limits.  Continue levothyroxine 25 mcg daily each morning.  Will continue to monitor.   Elevated LDL cholesterol level Assessment & Plan: Last lipid panel: LDL 124, HDL 71, triglycerides 99.  Risk low enough to continue managing with lifestyle interventions.  Will continue to monitor.     Return in about 1 year (around 07/26/2024) for annual physical, fasting blood work 1 week before.     Melida Quitter, PA

## 2023-07-27 NOTE — Assessment & Plan Note (Signed)
Last lipid panel: LDL 124, HDL 71, triglycerides 99.  Risk low enough to continue managing with lifestyle interventions.  Will continue to monitor.

## 2023-07-28 ENCOUNTER — Other Ambulatory Visit: Payer: Self-pay

## 2023-08-10 DIAGNOSIS — M9903 Segmental and somatic dysfunction of lumbar region: Secondary | ICD-10-CM | POA: Diagnosis not present

## 2023-08-10 DIAGNOSIS — M9905 Segmental and somatic dysfunction of pelvic region: Secondary | ICD-10-CM | POA: Diagnosis not present

## 2023-08-10 DIAGNOSIS — M9901 Segmental and somatic dysfunction of cervical region: Secondary | ICD-10-CM | POA: Diagnosis not present

## 2023-08-10 DIAGNOSIS — R519 Headache, unspecified: Secondary | ICD-10-CM | POA: Diagnosis not present

## 2023-08-10 DIAGNOSIS — M9902 Segmental and somatic dysfunction of thoracic region: Secondary | ICD-10-CM | POA: Diagnosis not present

## 2023-08-12 DIAGNOSIS — H5212 Myopia, left eye: Secondary | ICD-10-CM | POA: Diagnosis not present

## 2023-08-31 ENCOUNTER — Telehealth: Payer: Commercial Managed Care - PPO | Admitting: Family Medicine

## 2023-08-31 ENCOUNTER — Other Ambulatory Visit: Payer: Self-pay

## 2023-08-31 DIAGNOSIS — M9903 Segmental and somatic dysfunction of lumbar region: Secondary | ICD-10-CM | POA: Diagnosis not present

## 2023-08-31 DIAGNOSIS — B9689 Other specified bacterial agents as the cause of diseases classified elsewhere: Secondary | ICD-10-CM

## 2023-08-31 DIAGNOSIS — M9901 Segmental and somatic dysfunction of cervical region: Secondary | ICD-10-CM | POA: Diagnosis not present

## 2023-08-31 DIAGNOSIS — R519 Headache, unspecified: Secondary | ICD-10-CM | POA: Diagnosis not present

## 2023-08-31 DIAGNOSIS — J019 Acute sinusitis, unspecified: Secondary | ICD-10-CM | POA: Diagnosis not present

## 2023-08-31 DIAGNOSIS — M9902 Segmental and somatic dysfunction of thoracic region: Secondary | ICD-10-CM | POA: Diagnosis not present

## 2023-08-31 DIAGNOSIS — M9905 Segmental and somatic dysfunction of pelvic region: Secondary | ICD-10-CM | POA: Diagnosis not present

## 2023-08-31 MED ORDER — AMOXICILLIN-POT CLAVULANATE 875-125 MG PO TABS
1.0000 | ORAL_TABLET | Freq: Two times a day (BID) | ORAL | 0 refills | Status: AC
  Filled 2023-08-31: qty 14, 7d supply, fill #0

## 2023-08-31 NOTE — Progress Notes (Signed)

## 2023-08-31 NOTE — Addendum Note (Signed)
Addended by: Freddy Finner on: 08/31/2023 02:18 PM   Modules accepted: Orders

## 2023-09-01 DIAGNOSIS — L821 Other seborrheic keratosis: Secondary | ICD-10-CM | POA: Diagnosis not present

## 2023-09-01 DIAGNOSIS — L814 Other melanin hyperpigmentation: Secondary | ICD-10-CM | POA: Diagnosis not present

## 2023-09-01 DIAGNOSIS — Z872 Personal history of diseases of the skin and subcutaneous tissue: Secondary | ICD-10-CM | POA: Diagnosis not present

## 2023-09-01 DIAGNOSIS — D2372 Other benign neoplasm of skin of left lower limb, including hip: Secondary | ICD-10-CM | POA: Diagnosis not present

## 2023-10-05 ENCOUNTER — Other Ambulatory Visit: Payer: Self-pay

## 2023-10-05 DIAGNOSIS — M9903 Segmental and somatic dysfunction of lumbar region: Secondary | ICD-10-CM | POA: Diagnosis not present

## 2023-10-05 DIAGNOSIS — R519 Headache, unspecified: Secondary | ICD-10-CM | POA: Diagnosis not present

## 2023-10-05 DIAGNOSIS — M9905 Segmental and somatic dysfunction of pelvic region: Secondary | ICD-10-CM | POA: Diagnosis not present

## 2023-10-05 DIAGNOSIS — M9901 Segmental and somatic dysfunction of cervical region: Secondary | ICD-10-CM | POA: Diagnosis not present

## 2023-10-05 DIAGNOSIS — M9902 Segmental and somatic dysfunction of thoracic region: Secondary | ICD-10-CM | POA: Diagnosis not present

## 2023-10-06 ENCOUNTER — Other Ambulatory Visit: Payer: Self-pay

## 2023-10-22 ENCOUNTER — Encounter: Payer: Self-pay | Admitting: Family Medicine

## 2023-10-29 ENCOUNTER — Other Ambulatory Visit: Payer: Self-pay

## 2023-12-01 DIAGNOSIS — M9903 Segmental and somatic dysfunction of lumbar region: Secondary | ICD-10-CM | POA: Diagnosis not present

## 2023-12-01 DIAGNOSIS — M9901 Segmental and somatic dysfunction of cervical region: Secondary | ICD-10-CM | POA: Diagnosis not present

## 2023-12-01 DIAGNOSIS — R519 Headache, unspecified: Secondary | ICD-10-CM | POA: Diagnosis not present

## 2023-12-01 DIAGNOSIS — M9902 Segmental and somatic dysfunction of thoracic region: Secondary | ICD-10-CM | POA: Diagnosis not present

## 2023-12-01 DIAGNOSIS — M9905 Segmental and somatic dysfunction of pelvic region: Secondary | ICD-10-CM | POA: Diagnosis not present

## 2023-12-11 ENCOUNTER — Other Ambulatory Visit: Payer: Self-pay

## 2023-12-14 ENCOUNTER — Other Ambulatory Visit: Payer: Self-pay

## 2023-12-14 MED ORDER — ONDANSETRON 4 MG PO TBDP
4.0000 mg | ORAL_TABLET | Freq: Four times a day (QID) | ORAL | 1 refills | Status: DC | PRN
  Filled 2023-12-14: qty 30, 8d supply, fill #0

## 2023-12-21 ENCOUNTER — Other Ambulatory Visit: Payer: Self-pay

## 2023-12-25 ENCOUNTER — Other Ambulatory Visit: Payer: Self-pay

## 2023-12-25 MED ORDER — NORETHIN ACE-ETH ESTRAD-FE 1-20 MG-MCG PO TABS
1.0000 | ORAL_TABLET | Freq: Every day | ORAL | 0 refills | Status: DC
  Filled 2023-12-25: qty 84, 84d supply, fill #0

## 2024-01-05 ENCOUNTER — Other Ambulatory Visit: Payer: Self-pay

## 2024-01-12 DIAGNOSIS — R519 Headache, unspecified: Secondary | ICD-10-CM | POA: Diagnosis not present

## 2024-01-12 DIAGNOSIS — M9903 Segmental and somatic dysfunction of lumbar region: Secondary | ICD-10-CM | POA: Diagnosis not present

## 2024-01-12 DIAGNOSIS — M9905 Segmental and somatic dysfunction of pelvic region: Secondary | ICD-10-CM | POA: Diagnosis not present

## 2024-01-12 DIAGNOSIS — M9901 Segmental and somatic dysfunction of cervical region: Secondary | ICD-10-CM | POA: Diagnosis not present

## 2024-01-12 DIAGNOSIS — M9902 Segmental and somatic dysfunction of thoracic region: Secondary | ICD-10-CM | POA: Diagnosis not present

## 2024-01-26 ENCOUNTER — Other Ambulatory Visit (HOSPITAL_COMMUNITY): Payer: Self-pay

## 2024-01-26 DIAGNOSIS — Z124 Encounter for screening for malignant neoplasm of cervix: Secondary | ICD-10-CM | POA: Diagnosis not present

## 2024-01-26 DIAGNOSIS — Z01419 Encounter for gynecological examination (general) (routine) without abnormal findings: Secondary | ICD-10-CM | POA: Diagnosis not present

## 2024-01-26 DIAGNOSIS — Z760 Encounter for issue of repeat prescription: Secondary | ICD-10-CM | POA: Diagnosis not present

## 2024-01-26 MED ORDER — NORETHIN ACE-ETH ESTRAD-FE 1-20 MG-MCG PO TABS
1.0000 | ORAL_TABLET | Freq: Every day | ORAL | 4 refills | Status: AC
  Filled 2024-01-26 – 2024-03-19 (×2): qty 84, 84d supply, fill #0
  Filled 2024-06-12: qty 84, 84d supply, fill #1
  Filled 2024-09-09: qty 84, 84d supply, fill #2

## 2024-02-01 LAB — HM PAP SMEAR

## 2024-02-09 DIAGNOSIS — M9903 Segmental and somatic dysfunction of lumbar region: Secondary | ICD-10-CM | POA: Diagnosis not present

## 2024-02-09 DIAGNOSIS — M9902 Segmental and somatic dysfunction of thoracic region: Secondary | ICD-10-CM | POA: Diagnosis not present

## 2024-02-09 DIAGNOSIS — M9901 Segmental and somatic dysfunction of cervical region: Secondary | ICD-10-CM | POA: Diagnosis not present

## 2024-02-09 DIAGNOSIS — R519 Headache, unspecified: Secondary | ICD-10-CM | POA: Diagnosis not present

## 2024-02-09 DIAGNOSIS — M9905 Segmental and somatic dysfunction of pelvic region: Secondary | ICD-10-CM | POA: Diagnosis not present

## 2024-03-03 DIAGNOSIS — Z1231 Encounter for screening mammogram for malignant neoplasm of breast: Secondary | ICD-10-CM | POA: Diagnosis not present

## 2024-03-03 LAB — HM MAMMOGRAPHY

## 2024-03-08 DIAGNOSIS — M9902 Segmental and somatic dysfunction of thoracic region: Secondary | ICD-10-CM | POA: Diagnosis not present

## 2024-03-08 DIAGNOSIS — M9903 Segmental and somatic dysfunction of lumbar region: Secondary | ICD-10-CM | POA: Diagnosis not present

## 2024-03-08 DIAGNOSIS — R519 Headache, unspecified: Secondary | ICD-10-CM | POA: Diagnosis not present

## 2024-03-08 DIAGNOSIS — M9905 Segmental and somatic dysfunction of pelvic region: Secondary | ICD-10-CM | POA: Diagnosis not present

## 2024-03-08 DIAGNOSIS — M9901 Segmental and somatic dysfunction of cervical region: Secondary | ICD-10-CM | POA: Diagnosis not present

## 2024-03-21 ENCOUNTER — Other Ambulatory Visit (HOSPITAL_COMMUNITY): Payer: Self-pay

## 2024-03-21 ENCOUNTER — Other Ambulatory Visit: Payer: Self-pay

## 2024-04-04 DIAGNOSIS — M9901 Segmental and somatic dysfunction of cervical region: Secondary | ICD-10-CM | POA: Diagnosis not present

## 2024-04-04 DIAGNOSIS — M9902 Segmental and somatic dysfunction of thoracic region: Secondary | ICD-10-CM | POA: Diagnosis not present

## 2024-04-04 DIAGNOSIS — M9903 Segmental and somatic dysfunction of lumbar region: Secondary | ICD-10-CM | POA: Diagnosis not present

## 2024-04-04 DIAGNOSIS — M9905 Segmental and somatic dysfunction of pelvic region: Secondary | ICD-10-CM | POA: Diagnosis not present

## 2024-04-04 DIAGNOSIS — R519 Headache, unspecified: Secondary | ICD-10-CM | POA: Diagnosis not present

## 2024-05-02 DIAGNOSIS — M9905 Segmental and somatic dysfunction of pelvic region: Secondary | ICD-10-CM | POA: Diagnosis not present

## 2024-05-02 DIAGNOSIS — R519 Headache, unspecified: Secondary | ICD-10-CM | POA: Diagnosis not present

## 2024-05-02 DIAGNOSIS — M9901 Segmental and somatic dysfunction of cervical region: Secondary | ICD-10-CM | POA: Diagnosis not present

## 2024-05-02 DIAGNOSIS — M9903 Segmental and somatic dysfunction of lumbar region: Secondary | ICD-10-CM | POA: Diagnosis not present

## 2024-05-02 DIAGNOSIS — M9902 Segmental and somatic dysfunction of thoracic region: Secondary | ICD-10-CM | POA: Diagnosis not present

## 2024-05-10 ENCOUNTER — Other Ambulatory Visit: Payer: Self-pay | Admitting: Gastroenterology

## 2024-05-10 ENCOUNTER — Other Ambulatory Visit (HOSPITAL_COMMUNITY): Payer: Self-pay

## 2024-05-10 DIAGNOSIS — K219 Gastro-esophageal reflux disease without esophagitis: Secondary | ICD-10-CM

## 2024-05-10 MED ORDER — OMEPRAZOLE 20 MG PO CPDR
20.0000 mg | DELAYED_RELEASE_CAPSULE | Freq: Every day | ORAL | 0 refills | Status: DC
Start: 2024-05-10 — End: 2024-08-03
  Filled 2024-05-10: qty 90, 90d supply, fill #0

## 2024-06-06 DIAGNOSIS — M9905 Segmental and somatic dysfunction of pelvic region: Secondary | ICD-10-CM | POA: Diagnosis not present

## 2024-06-06 DIAGNOSIS — M9902 Segmental and somatic dysfunction of thoracic region: Secondary | ICD-10-CM | POA: Diagnosis not present

## 2024-06-06 DIAGNOSIS — R519 Headache, unspecified: Secondary | ICD-10-CM | POA: Diagnosis not present

## 2024-06-06 DIAGNOSIS — M9903 Segmental and somatic dysfunction of lumbar region: Secondary | ICD-10-CM | POA: Diagnosis not present

## 2024-06-06 DIAGNOSIS — M9901 Segmental and somatic dysfunction of cervical region: Secondary | ICD-10-CM | POA: Diagnosis not present

## 2024-07-05 ENCOUNTER — Telehealth: Payer: Self-pay | Admitting: *Deleted

## 2024-07-05 DIAGNOSIS — M9902 Segmental and somatic dysfunction of thoracic region: Secondary | ICD-10-CM | POA: Diagnosis not present

## 2024-07-05 DIAGNOSIS — M9905 Segmental and somatic dysfunction of pelvic region: Secondary | ICD-10-CM | POA: Diagnosis not present

## 2024-07-05 DIAGNOSIS — M9901 Segmental and somatic dysfunction of cervical region: Secondary | ICD-10-CM | POA: Diagnosis not present

## 2024-07-05 DIAGNOSIS — M9903 Segmental and somatic dysfunction of lumbar region: Secondary | ICD-10-CM | POA: Diagnosis not present

## 2024-07-05 DIAGNOSIS — R519 Headache, unspecified: Secondary | ICD-10-CM | POA: Diagnosis not present

## 2024-07-05 NOTE — Telephone Encounter (Signed)
 LVM informing pt that we need to reschedule her appt on 07/28/24 due to the provider being out of the office.  Please assist in getting this rescheduled if she calls back. I will also send her a mychart message.

## 2024-07-11 ENCOUNTER — Other Ambulatory Visit (HOSPITAL_COMMUNITY): Payer: Self-pay

## 2024-07-11 MED ORDER — FLUZONE 0.5 ML IM SUSY
0.5000 mL | PREFILLED_SYRINGE | Freq: Once | INTRAMUSCULAR | 0 refills | Status: AC
  Filled 2024-07-11: qty 0.5, 1d supply, fill #0

## 2024-07-13 ENCOUNTER — Telehealth: Payer: Self-pay | Admitting: Gastroenterology

## 2024-07-13 NOTE — Telephone Encounter (Signed)
 Inbound call from pt stating that she had a upper endo with Dr. San and she was fine up until recently she has been having really bad heartburn. Pt is requesting to speak to the nurse to find out what she is needing to do weather it be upping her medication or any other suggestions. please advise.

## 2024-07-13 NOTE — Telephone Encounter (Signed)
 Pt has been schedule with Dr. San for December the 17 th. Patient is wanting to know what can she do in the mean time in regards to her heartburn. Patient stated a phone call or a mychart message would be fine. Please advise.

## 2024-07-18 ENCOUNTER — Other Ambulatory Visit: Payer: Self-pay

## 2024-07-18 ENCOUNTER — Other Ambulatory Visit: Payer: Self-pay | Admitting: Family Medicine

## 2024-07-18 DIAGNOSIS — Z1159 Encounter for screening for other viral diseases: Secondary | ICD-10-CM

## 2024-07-18 DIAGNOSIS — E039 Hypothyroidism, unspecified: Secondary | ICD-10-CM

## 2024-07-18 DIAGNOSIS — Z6825 Body mass index (BMI) 25.0-25.9, adult: Secondary | ICD-10-CM

## 2024-07-18 DIAGNOSIS — E7800 Pure hypercholesterolemia, unspecified: Secondary | ICD-10-CM

## 2024-07-18 NOTE — Telephone Encounter (Signed)
 Contacted pt and rescheduled lab at her request

## 2024-07-19 ENCOUNTER — Other Ambulatory Visit: Payer: Self-pay

## 2024-07-19 ENCOUNTER — Other Ambulatory Visit (HOSPITAL_COMMUNITY): Payer: Self-pay

## 2024-07-19 MED ORDER — LEVOTHYROXINE SODIUM 25 MCG PO TABS
25.0000 ug | ORAL_TABLET | Freq: Every day | ORAL | 3 refills | Status: AC
  Filled 2024-07-19: qty 90, 90d supply, fill #0
  Filled 2024-10-15: qty 90, 90d supply, fill #1

## 2024-07-20 ENCOUNTER — Other Ambulatory Visit: Payer: Commercial Managed Care - PPO

## 2024-07-25 ENCOUNTER — Other Ambulatory Visit: Payer: Self-pay

## 2024-07-25 DIAGNOSIS — Z1159 Encounter for screening for other viral diseases: Secondary | ICD-10-CM

## 2024-07-25 DIAGNOSIS — Z6825 Body mass index (BMI) 25.0-25.9, adult: Secondary | ICD-10-CM

## 2024-07-27 ENCOUNTER — Encounter: Payer: Commercial Managed Care - PPO | Admitting: Family Medicine

## 2024-07-28 ENCOUNTER — Encounter

## 2024-07-28 ENCOUNTER — Other Ambulatory Visit

## 2024-07-28 ENCOUNTER — Encounter: Admitting: Family Medicine

## 2024-07-28 DIAGNOSIS — Z1159 Encounter for screening for other viral diseases: Secondary | ICD-10-CM

## 2024-07-28 DIAGNOSIS — Z6825 Body mass index (BMI) 25.0-25.9, adult: Secondary | ICD-10-CM

## 2024-07-28 DIAGNOSIS — E663 Overweight: Secondary | ICD-10-CM | POA: Diagnosis not present

## 2024-07-29 ENCOUNTER — Ambulatory Visit: Payer: Self-pay | Admitting: Family Medicine

## 2024-07-29 LAB — COMPREHENSIVE METABOLIC PANEL WITH GFR
ALT: 14 IU/L (ref 0–32)
AST: 17 IU/L (ref 0–40)
Albumin: 4.3 g/dL (ref 3.9–4.9)
Alkaline Phosphatase: 41 IU/L (ref 41–116)
BUN/Creatinine Ratio: 15 (ref 9–23)
BUN: 13 mg/dL (ref 6–20)
Bilirubin Total: 0.5 mg/dL (ref 0.0–1.2)
CO2: 22 mmol/L (ref 20–29)
Calcium: 9.3 mg/dL (ref 8.7–10.2)
Chloride: 101 mmol/L (ref 96–106)
Creatinine, Ser: 0.86 mg/dL (ref 0.57–1.00)
Globulin, Total: 2.4 g/dL (ref 1.5–4.5)
Glucose: 87 mg/dL (ref 70–99)
Potassium: 4.4 mmol/L (ref 3.5–5.2)
Sodium: 139 mmol/L (ref 134–144)
Total Protein: 6.7 g/dL (ref 6.0–8.5)
eGFR: 89 mL/min/1.73 (ref >59)

## 2024-07-29 LAB — CBC WITH DIFFERENTIAL/PLATELET
Basophils Absolute: 0 x10E3/uL (ref 0.0–0.2)
Basos: 1 %
EOS (ABSOLUTE): 0.1 x10E3/uL (ref 0.0–0.4)
Eos: 2 %
Hematocrit: 41.5 % (ref 34.0–46.6)
Hemoglobin: 13.7 g/dL (ref 11.1–15.9)
Immature Grans (Abs): 0 x10E3/uL (ref 0.0–0.1)
Immature Granulocytes: 0 %
Lymphocytes Absolute: 1.9 x10E3/uL (ref 0.7–3.1)
Lymphs: 37 %
MCH: 30.9 pg (ref 26.6–33.0)
MCHC: 33 g/dL (ref 31.5–35.7)
MCV: 94 fL (ref 79–97)
Monocytes Absolute: 0.3 x10E3/uL (ref 0.1–0.9)
Monocytes: 7 %
Neutrophils Absolute: 2.7 x10E3/uL (ref 1.4–7.0)
Neutrophils: 53 %
Platelets: 350 x10E3/uL (ref 150–450)
RBC: 4.44 x10E6/uL (ref 3.77–5.28)
RDW: 11.8 % (ref 11.7–15.4)
WBC: 5.1 x10E3/uL (ref 3.4–10.8)

## 2024-07-29 LAB — TSH: TSH: 1.86 u[IU]/mL (ref 0.450–4.500)

## 2024-07-29 LAB — HEMOGLOBIN A1C
Est. average glucose Bld gHb Est-mCnc: 97 mg/dL
Hgb A1c MFr Bld: 5 % (ref 4.8–5.6)

## 2024-07-29 LAB — LIPID PANEL
Chol/HDL Ratio: 3.1 ratio (ref 0.0–4.4)
Cholesterol, Total: 169 mg/dL (ref 100–199)
HDL: 55 mg/dL (ref >39)
LDL Chol Calc (NIH): 88 mg/dL (ref 0–99)
Triglycerides: 148 mg/dL (ref 0–149)
VLDL Cholesterol Cal: 26 mg/dL (ref 5–40)

## 2024-07-29 LAB — HEPATITIS C ANTIBODY: Hep C Virus Ab: NONREACTIVE

## 2024-07-29 LAB — VITAMIN D 25 HYDROXY (VIT D DEFICIENCY, FRACTURES): Vit D, 25-Hydroxy: 46.6 ng/mL (ref 30.0–100.0)

## 2024-08-01 DIAGNOSIS — M9901 Segmental and somatic dysfunction of cervical region: Secondary | ICD-10-CM | POA: Diagnosis not present

## 2024-08-01 DIAGNOSIS — M9903 Segmental and somatic dysfunction of lumbar region: Secondary | ICD-10-CM | POA: Diagnosis not present

## 2024-08-01 DIAGNOSIS — M9902 Segmental and somatic dysfunction of thoracic region: Secondary | ICD-10-CM | POA: Diagnosis not present

## 2024-08-01 DIAGNOSIS — M9905 Segmental and somatic dysfunction of pelvic region: Secondary | ICD-10-CM | POA: Diagnosis not present

## 2024-08-01 DIAGNOSIS — R519 Headache, unspecified: Secondary | ICD-10-CM | POA: Diagnosis not present

## 2024-08-03 ENCOUNTER — Other Ambulatory Visit: Payer: Self-pay

## 2024-08-03 ENCOUNTER — Other Ambulatory Visit (HOSPITAL_COMMUNITY): Payer: Self-pay

## 2024-08-03 ENCOUNTER — Other Ambulatory Visit: Payer: Self-pay | Admitting: Gastroenterology

## 2024-08-03 DIAGNOSIS — K219 Gastro-esophageal reflux disease without esophagitis: Secondary | ICD-10-CM

## 2024-08-03 MED ORDER — OMEPRAZOLE 20 MG PO CPDR
20.0000 mg | DELAYED_RELEASE_CAPSULE | Freq: Every day | ORAL | 0 refills | Status: DC
  Filled 2024-08-03: qty 90, 90d supply, fill #0

## 2024-08-15 ENCOUNTER — Ambulatory Visit (INDEPENDENT_AMBULATORY_CARE_PROVIDER_SITE_OTHER)

## 2024-08-15 ENCOUNTER — Encounter

## 2024-08-15 ENCOUNTER — Other Ambulatory Visit (HOSPITAL_COMMUNITY): Payer: Self-pay

## 2024-08-15 VITALS — BP 117/78 | Ht 67.0 in

## 2024-08-15 DIAGNOSIS — K219 Gastro-esophageal reflux disease without esophagitis: Secondary | ICD-10-CM

## 2024-08-15 DIAGNOSIS — E039 Hypothyroidism, unspecified: Secondary | ICD-10-CM

## 2024-08-15 DIAGNOSIS — Z0000 Encounter for general adult medical examination without abnormal findings: Secondary | ICD-10-CM | POA: Insufficient documentation

## 2024-08-15 DIAGNOSIS — E7800 Pure hypercholesterolemia, unspecified: Secondary | ICD-10-CM | POA: Diagnosis not present

## 2024-08-15 MED ORDER — FAMOTIDINE 40 MG PO TABS
40.0000 mg | ORAL_TABLET | Freq: Every day | ORAL | 1 refills | Status: AC
  Filled 2024-08-15: qty 30, 30d supply, fill #0

## 2024-08-15 NOTE — Assessment & Plan Note (Signed)
 Symptoms managed with omeprazole . Discussed adding famotidine  for long-term management and potential switch to rabeprazole. Reviewed risks of long-term PPI use. - Continue omeprazole  20 mg twice daily. - Prescribed famotidine  at bedtime to replace 2nd omeprazole  dose. Advised that if this switch does not keep sx well controlled, to go back to omeprazole  BID until upcoming appt with GI  - Follow up with GI specialist in two weeks.

## 2024-08-15 NOTE — Patient Instructions (Addendum)
 VISIT SUMMARY:  Today, we discussed your reflux symptoms, thyroid  management, menstrual health, and general preventive care. Your reflux is currently managed with medication, and your thyroid  function is stable. We also reviewed your cholesterol levels and preventive health measures.  YOUR PLAN:  HYPOTHYROIDISM: Your thyroid  condition is well-managed with your current medication. -Continue taking levothyroxine  as prescribed. -Schedule a thyroid  ultrasound in 2027.  GASTROESOPHAGEAL REFLUX DISEASE (GERD): Your reflux symptoms are manageable with your current medication. -Continue taking omeprazole  20 mg once daily in the mornings. We can try replacing the night time dose of omeprazole  with famotidine 40 mg at bedtime. If this does not keep symptoms well controlled, you can go back to omeprazole  20 mg twice per day until you see your GI specialist! -Follow up with your GI specialist in two weeks.  HYPERLIPIDEMIA: Your cholesterol levels are normal, and your cardiovascular risk is low. -Continue monitoring your cholesterol levels.  GENERAL HEALTH MAINTENANCE: Your preventive care is up to date except for a tetanus vaccination. -Schedule a tetanus vaccination at your convenience.  -Continue mammogram screening starting at age 40. -Begin colon cancer screening at age 65 unless family history changes.

## 2024-08-15 NOTE — Assessment & Plan Note (Signed)
 Health maintenance up to date except tetanus vaccination due. Mammogram screening started early due to family history. Colon cancer screening planned at age 38. - Schedule tetanus vaccination. - Continue mammogram screening starting at age 43. - Begin colon cancer screening at age 66 unless family history changes. - Will obtain pap records from Dr. Marget.  F/u CPE in 1 year, sooner prn.

## 2024-08-15 NOTE — Assessment & Plan Note (Signed)
 TSH within normal limits.  Continue levothyroxine  25 mcg daily each morning. Surveillance thyroid  u/s q 3 years due to hx of nodules.  Will continue to monitor.

## 2024-08-15 NOTE — Assessment & Plan Note (Signed)
 Last lipid panel: LDL 88, HDL 55, Trig 148. Improved. Continue with diet and lifestyle changes. Will cont to monitor.

## 2024-08-15 NOTE — Progress Notes (Signed)
 Complete physical exam  Patient: Jacqueline Chen   DOB: 05-11-1986   38 y.o. Female  MRN: [id number]  Subjective:    Chief Complaint  Patient presents with   Annual Exam   Jacqueline Chen is a 38 year old female who presents for CPE.   Gastroesophageal reflux symptoms - Reflux symptoms present and described as manageable after recent increase in omeprazole  dosing  - Currently taking omeprazole  20 mg twice daily with symptomatic improvement - Upper endoscopy last fall was normal except for a tiny hiatal hernia; slight dilation performed at that time - No changes in diet or food intake - Upcoming gastroenterology appointment in two weeks to discuss worsening GERD sx  Thyroid  dysfunction - Hypothyroidism managed with low dose levothyroxine  - Presence of thyroid  nodules; thyroid  gland intact - Surveillance thyroid  u/s q 3 years.    Menstrual and reproductive health - Currently menstruating; periods are normal while on Tarina  Fe birth control  General health and preventive care - Sleeping well and feeling good - Regular bowel movements - First mammogram this year was normal; plans to start regular screening at age 82. Follow with OBGYN.  - No family history of colon cancer; colon cancer screening planned to start at age 38 unless family history changes  Medication and supplement use - Takes multivitamin, women's Wednesday vitamin, and nightly allergy medication - Uses Zofran  as needed for trips     Most recent fall risk assessment:    08/15/2024    9:56 AM  Fall Risk   Falls in the past year? 0  Follow up Falls evaluation completed     Most recent depression screenings:    08/15/2024    9:56 AM 07/27/2023    8:18 AM  PHQ 2/9 Scores  PHQ - 2 Score 0 0  PHQ- 9 Score 0 0      Data saved with a previous flowsheet row definition    Vision:Within last year and Dental: No current dental problems and Receives regular dental care    Patient Care Team: Gayle Saddie JULIANNA DEVONNA as PCP - General (Physician Assistant) Marget Lenis, MD as Consulting Physician (Obstetrics and Gynecology) Ivin Kocher, MD as Consulting Physician (Dermatology)   Outpatient Medications Prior to Visit  Medication Sig   cetirizine (ZYRTEC) 10 MG tablet Take 10 mg by mouth daily.   levothyroxine  (SYNTHROID ) 25 MCG tablet Take 1 tablet (25 mcg total) by mouth daily before breakfast.   Multiple Vitamins-Minerals (ONE-A-DAY WOMENS PO) Take 1 tablet by mouth daily.   norethindrone -ethinyl estradiol -FE (TARINA  FE 1/20 EQ) 1-20 MG-MCG tablet Take 1 tablet by mouth daily.   omeprazole  (PRILOSEC) 20 MG capsule Take 1 capsule (20 mg total) by mouth daily. Patient needs follow up appointment for future refills. Please call [phone] to schedule.   [DISCONTINUED] ondansetron  (ZOFRAN -ODT) 4 MG disintegrating tablet Take 1 tablet (4 mg total) by mouth every 6 (six) hours as needed for nausea.   No facility-administered medications prior to visit.    ROS Per HPI        Objective:     Ht 5' 7 (1.702 m)   LMP 08/15/2024   BMI 25.84 kg/m    Physical Exam Constitutional:      General: She is not in acute distress.    Appearance: Normal appearance.  Eyes:     Pupils: Pupils are equal, round, and reactive to light.  Cardiovascular:     Rate and Rhythm: Normal rate and regular rhythm.  Heart sounds: Normal heart sounds. No murmur heard.    No friction rub. No gallop.  Pulmonary:     Effort: Pulmonary effort is normal. No respiratory distress.     Breath sounds: Normal breath sounds.  Abdominal:     General: Abdomen is flat. Bowel sounds are normal.     Palpations: Abdomen is soft.     Tenderness: There is no abdominal tenderness.  Musculoskeletal:        General: No swelling.     Cervical back: Neck supple.  Lymphadenopathy:     Cervical: No cervical adenopathy.  Skin:    General: Skin is warm and dry.  Neurological:     General: No focal deficit present.      Mental Status: She is alert.  Psychiatric:        Mood and Affect: Mood normal.        Behavior: Behavior normal.        Thought Content: Thought content normal.       No results found for any visits on 08/15/24. Last CBC Lab Results  Component Value Date   WBC 5.1 07/28/2024   HGB 13.7 07/28/2024   HCT 41.5 07/28/2024   MCV 94 07/28/2024   MCH 30.9 07/28/2024   RDW 11.8 07/28/2024   PLT 350 07/28/2024   Last metabolic panel Lab Results  Component Value Date   GLUCOSE 87 07/28/2024   NA 139 07/28/2024   K 4.4 07/28/2024   CL 101 07/28/2024   CO2 22 07/28/2024   BUN 13 07/28/2024   CREATININE 0.86 07/28/2024   EGFR 89 07/28/2024   CALCIUM 9.3 07/28/2024   PROT 6.7 07/28/2024   ALBUMIN 4.3 07/28/2024   LABGLOB 2.4 07/28/2024   AGRATIO 1.7 07/21/2022   BILITOT 0.5 07/28/2024   ALKPHOS 41 07/28/2024   AST 17 07/28/2024   ALT 14 07/28/2024   Last lipids Lab Results  Component Value Date   CHOL 169 07/28/2024   HDL 55 07/28/2024   LDLCALC 88 07/28/2024   TRIG 148 07/28/2024   CHOLHDL 3.1 07/28/2024   Last hemoglobin A1c Lab Results  Component Value Date   HGBA1C 5.0 07/28/2024   Last thyroid  functions Lab Results  Component Value Date   TSH 1.860 07/28/2024   FREET4 1.3 07/19/2015   Last vitamin D  Lab Results  Component Value Date   VD25OH 46.6 07/28/2024        Assessment & Plan:    Routine Health Maintenance and Physical Exam  Health Maintenance  Topic Date Due   Hepatitis B Vaccine (1 of 3 - 19+ 3-dose series) Never done   HPV Vaccine (1 - 3-dose SCDM series) Never done   Pap with HPV screening  10/17/2023   DTaP/Tdap/Td vaccine (2 - Td or Tdap) 01/20/2024   COVID-19 Vaccine (4 - 2025-26 season) 05/16/2024   Flu Shot  Completed   Hepatitis C Screening  Completed   HIV Screening  Completed   Pneumococcal Vaccine  Aged Out   Meningitis B Vaccine  Aged Out    Discussed health benefits of physical activity, and encouraged her to engage  in regular exercise appropriate for her age and condition.  Hypothyroidism, unspecified type Assessment & Plan: TSH within normal limits.  Continue levothyroxine  25 mcg daily each morning. Surveillance thyroid  u/s q 3 years due to hx of nodules.  Will continue to monitor.   Gastroesophageal reflux disease, unspecified whether esophagitis present Assessment & Plan: Symptoms managed with omeprazole . Discussed adding famotidine   for long-term management and potential switch to rabeprazole. Reviewed risks of long-term PPI use. - Continue omeprazole  20 mg twice daily. - Prescribed famotidine at bedtime to replace 2nd omeprazole  dose. Advised that if this switch does not keep sx well controlled, to go back to omeprazole  BID until upcoming appt with GI  - Follow up with GI specialist in two weeks.   Elevated LDL cholesterol level Assessment & Plan: Last lipid panel: LDL 88, HDL 55, Trig 148. Improved. Continue with diet and lifestyle changes. Will cont to monitor.   Wellness examination Assessment & Plan: Health maintenance up to date except tetanus vaccination due. Mammogram screening started early due to family history. Colon cancer screening planned at age 52. - Schedule tetanus vaccination. - Continue mammogram screening starting at age 44. - Begin colon cancer screening at age 56 unless family history changes. - Will obtain pap records from Dr. Marget.  F/u CPE in 1 year, sooner prn.    Other orders -     Famotidine; Take 1 tablet (40 mg total) by mouth at bedtime.  Dispense: 30 tablet; Refill: 1    Return in about 1 year (around 08/15/2025) for Physical.     Saddie JULIANNA Sacks, PA-C

## 2024-08-16 DIAGNOSIS — H5212 Myopia, left eye: Secondary | ICD-10-CM | POA: Diagnosis not present

## 2024-08-18 ENCOUNTER — Other Ambulatory Visit (HOSPITAL_COMMUNITY): Payer: Self-pay

## 2024-08-18 MED ORDER — AMOXICILLIN-POT CLAVULANATE 875-125 MG PO TABS
1.0000 | ORAL_TABLET | Freq: Two times a day (BID) | ORAL | 0 refills | Status: DC
  Filled 2024-08-18: qty 14, 7d supply, fill #0

## 2024-08-29 DIAGNOSIS — M9902 Segmental and somatic dysfunction of thoracic region: Secondary | ICD-10-CM | POA: Diagnosis not present

## 2024-08-29 DIAGNOSIS — M9903 Segmental and somatic dysfunction of lumbar region: Secondary | ICD-10-CM | POA: Diagnosis not present

## 2024-08-29 DIAGNOSIS — R519 Headache, unspecified: Secondary | ICD-10-CM | POA: Diagnosis not present

## 2024-08-29 DIAGNOSIS — M9905 Segmental and somatic dysfunction of pelvic region: Secondary | ICD-10-CM | POA: Diagnosis not present

## 2024-08-29 DIAGNOSIS — M9901 Segmental and somatic dysfunction of cervical region: Secondary | ICD-10-CM | POA: Diagnosis not present

## 2024-08-31 ENCOUNTER — Encounter: Payer: Self-pay | Admitting: Gastroenterology

## 2024-08-31 ENCOUNTER — Ambulatory Visit: Admitting: Gastroenterology

## 2024-08-31 ENCOUNTER — Other Ambulatory Visit (HOSPITAL_COMMUNITY): Payer: Self-pay

## 2024-08-31 VITALS — BP 100/70 | HR 80 | Ht 67.0 in | Wt 168.5 lb

## 2024-08-31 DIAGNOSIS — K449 Diaphragmatic hernia without obstruction or gangrene: Secondary | ICD-10-CM | POA: Diagnosis not present

## 2024-08-31 DIAGNOSIS — K219 Gastro-esophageal reflux disease without esophagitis: Secondary | ICD-10-CM

## 2024-08-31 DIAGNOSIS — R12 Heartburn: Secondary | ICD-10-CM | POA: Diagnosis not present

## 2024-08-31 DIAGNOSIS — R1110 Vomiting, unspecified: Secondary | ICD-10-CM

## 2024-08-31 MED ORDER — OMEPRAZOLE 20 MG PO CPDR
20.0000 mg | DELAYED_RELEASE_CAPSULE | Freq: Two times a day (BID) | ORAL | 3 refills | Status: AC
  Filled 2024-08-31 – 2024-09-12 (×4): qty 180, 90d supply, fill #0

## 2024-08-31 NOTE — Patient Instructions (Addendum)
 _______________________________________________________  If your blood pressure at your visit was 140/90 or greater, please contact your primary care physician to follow up on this.  _______________________________________________________  If you are age 38 or older, your body mass index should be between 23-30. Your Body mass index is 26.39 kg/m. If this is out of the aforementioned range listed, please consider follow up with your Primary Care Provider.  If you are age 101 or younger, your body mass index should be between 19-25. Your Body mass index is 26.39 kg/m. If this is out of the aformentioned range listed, please consider follow up with your Primary Care Provider.   ________________________________________________________  The [facility] providers would like to encourage you to use MYCHART to communicate with providers for non-urgent requests or questions.  Due to long hold times on the telephone, sending your provider a message by [facility] may be a faster and more efficient way to get a response.  Please allow 48 business hours for a response.  Please remember that this is for non-urgent requests.  _______________________________________________________  [facility] is using a team-based approach to care.  Your team is made up of your doctor and two to three APPS. Our APPS (Nurse Practitioners and Physician Assistants) work with your physician to ensure care continuity for you. They are fully qualified to address your health concerns and develop a treatment plan. They communicate directly with your gastroenterologist to care for you. Seeing the Advanced Practice Practitioners on your physician's team can help you by facilitating care more promptly, often allowing for earlier appointments, access to diagnostic testing, procedures, and other specialty referrals.   We have sent the following medications to your pharmacy for you to pick up at your convenience:  INCREASE:  Prilosec 20mg  one capsule two times daily.   It was a pleasure to see you today!  Vito Cirigliano, D.O.

## 2024-08-31 NOTE — Progress Notes (Signed)
 Chief Complaint:    GERD, heartburn  GI History: Jacqueline Chen is a 38 y.o. female referred to the [facility] for GERD.   Longstanding history of heartburn since high school, generally controlled with Tums on demand and dietary modifications for many years.  Had worsening of reflux during pregnancy x 2, treated with short course of Prilosec with improvement.  Reflux symptoms started worsening in 2024 with increasing heartburn, regurgitation.  Good response to OTC Prilosec with return of symptoms anytime she titrated off.  - 06/08/2023: EGD: Benign gastric inlet patch, 1-2 cm sliding hiatal hernia with Hill grade 3 valve, normal esophagus empirically dilated with 20 mm TTS balloon and biopsies negative for EOE, mild non-H. pylori gastritis  HPI:     Patient is a 38 y.o. female presenting to the [facility] for follow-up.   Was doing well from a reflux standpoint on Prilosec 20 mg daily, but started having regular breakthrough symptoms in October.  Symptoms were predominantly during the day.  Started taking Tums, then increased Prilosec to 20 mg twice daily.  No dysphagia.  Reflux symptoms have since resolved again.  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  Reviewed most recent labs from 07/2024: Normal CBC, CMP, hemoglobin A1c, TSH, vitamin D .  No new abdominal or chest imaging for review.      Latest Ref Rng & Units 07/28/2024    8:21 AM 07/23/2023    8:26 AM 07/21/2022    9:20 AM  CBC  WBC 3.4 - 10.8 x10E3/uL 5.1  5.0  4.8   Hemoglobin 11.1 - 15.9 g/dL 86.2  86.0  85.5   Hematocrit 34.0 - 46.6 % 41.5  43.1  41.8   Platelets 150 - 450 x10E3/uL 350  339  325       Latest Ref Rng & Units 07/28/2024    8:21 AM 07/23/2023    8:26 AM 07/21/2022    9:20 AM  CMP  Glucose 70 - 99 mg/dL 87  82  91   BUN 6 - 20 mg/dL 13  10  14    Creatinine 0.57 - 1.00 mg/dL 9.13  9.18  9.26   Sodium 134 - 144 mmol/L 139  138  139    Potassium 3.5 - 5.2 mmol/L 4.4  5.0  4.6   Chloride 96 - 106 mmol/L 101  103  103   CO2 20 - 29 mmol/L 22  22  22    Calcium 8.7 - 10.2 mg/dL 9.3  9.7  9.6   Total Protein 6.0 - 8.5 g/dL 6.7  7.0  7.3   Total Bilirubin 0.0 - 1.2 mg/dL 0.5  0.3  0.4   Alkaline Phos 41 - 116 IU/L 41  50  49   AST 0 - 40 IU/L 17  21  20    ALT 0 - 32 IU/L 14  16  16       Review of systems:     No chest pain, no SOB, no fevers, no urinary sx   Past Medical History:  Diagnosis Date   GERD (gastroesophageal reflux disease)    Goiter 2015   multinodular goiter   Thyroid  disease    [employer] 05/09/2020   Wellness examination 08/15/2024    Patient's surgical history, family medical history, social history, medications and allergies were all reviewed in Epic    Current Outpatient Medications  Medication Sig Dispense Refill   cetirizine (ZYRTEC) 10 MG tablet Take 10 mg by mouth daily.  levothyroxine  (SYNTHROID ) 25 MCG tablet Take 1 tablet (25 mcg total) by mouth daily before breakfast. 90 tablet 3   Multiple Vitamins-Minerals (ONE-A-DAY WOMENS PO) Take 1 tablet by mouth daily.     norethindrone -ethinyl estradiol -FE (TARINA  FE 1/20 EQ) 1-20 MG-MCG tablet Take 1 tablet by mouth daily. 84 tablet 4   omeprazole  (PRILOSEC) 20 MG capsule Take 1 capsule (20 mg total) by mouth daily. Patient needs follow up appointment for future refills. Please call [phone] to schedule. 90 capsule 0   famotidine  (PEPCID ) 40 MG tablet Take 1 tablet (40 mg total) by mouth at bedtime. (Patient not taking: Reported on 08/31/2024) 30 tablet 1   No current facility-administered medications for this visit.    Physical Exam:     BP 100/70 (BP Location: Left Arm, Patient Position: Sitting)   Pulse 80   Ht 5' 7 (1.702 m) Comment: height measured without shoes  Wt 168 lb 8 oz (76.4 kg)   LMP 08/15/2024   BMI 26.39 kg/m   GENERAL:  Pleasant female in NAD PSYCH: : Cooperative, normal affect Musculoskeletal:  Normal muscle  tone, normal strength NEURO: Alert and oriented x 3, no focal neurologic deficits   IMPRESSION and PLAN:    1) GERD 2) Heartburn 3) Regurgitation 4) Hiatal hernia 38 year old female with longstanding history of GERD, with recent breakthrough despite Prilosec 20 mg daily.  Has since recapture control of reflux after increasing Prilosec to 20 mg twice daily.  Discussed pathophysiology of reflux today with plan for the following:  - Continue Prilosec at 20 mg twice daily for the time being.  After the holidays, can try reducing back to 20 mg daily.  If regular breakthrough, bump back up and continue with twice daily dosing - Briefly discussed the role of hiatal hernia repair and antireflux surgery.  Prefers to continue with medical management for the time being - Placed Rx for Prilosec 20 mg BID #180, RF 3  RTC prn          Sandor LULLA Flatter ,DO, FACG 08/31/2024, 9:57 AM

## 2024-09-01 DIAGNOSIS — D2372 Other benign neoplasm of skin of left lower limb, including hip: Secondary | ICD-10-CM | POA: Diagnosis not present

## 2024-09-01 DIAGNOSIS — Z872 Personal history of diseases of the skin and subcutaneous tissue: Secondary | ICD-10-CM | POA: Diagnosis not present

## 2024-09-01 DIAGNOSIS — D1801 Hemangioma of skin and subcutaneous tissue: Secondary | ICD-10-CM | POA: Diagnosis not present

## 2024-09-01 DIAGNOSIS — L821 Other seborrheic keratosis: Secondary | ICD-10-CM | POA: Diagnosis not present

## 2024-09-01 DIAGNOSIS — L814 Other melanin hyperpigmentation: Secondary | ICD-10-CM | POA: Diagnosis not present

## 2024-09-09 ENCOUNTER — Other Ambulatory Visit (HOSPITAL_COMMUNITY): Payer: Self-pay

## 2024-09-09 ENCOUNTER — Other Ambulatory Visit: Payer: Self-pay

## 2024-09-12 ENCOUNTER — Other Ambulatory Visit (HOSPITAL_COMMUNITY): Payer: Self-pay

## 2024-09-21 ENCOUNTER — Other Ambulatory Visit (HOSPITAL_COMMUNITY): Payer: Self-pay

## 2024-10-16 ENCOUNTER — Other Ambulatory Visit (HOSPITAL_COMMUNITY): Payer: Self-pay

## 2024-10-17 ENCOUNTER — Other Ambulatory Visit: Payer: Self-pay

## 2025-08-07 ENCOUNTER — Other Ambulatory Visit

## 2025-08-17 ENCOUNTER — Encounter
